# Patient Record
Sex: Female | Born: 1960 | Race: Black or African American | Hispanic: Refuse to answer | Smoking: Never smoker
Health system: Southern US, Community
[De-identification: ages and names within clinical notes are randomized; demographics above are authoritative.]

## PROBLEM LIST (undated history)

## (undated) DIAGNOSIS — I10 Essential (primary) hypertension: Secondary | ICD-10-CM

## (undated) HISTORY — PX: TUBAL LIGATION: SHX77

## (undated) HISTORY — PX: OTHER SURGICAL HISTORY: SHX169

---

## 2002-08-22 LAB — HM PAP SMEAR: HM PAP: NORMAL

## 2009-10-19 ENCOUNTER — Ambulatory Visit: Payer: Self-pay | Admitting: Family Medicine

## 2010-11-03 ENCOUNTER — Emergency Department: Payer: Self-pay | Admitting: Emergency Medicine

## 2013-04-06 ENCOUNTER — Emergency Department (HOSPITAL_COMMUNITY): Admitting: Anesthesiology

## 2013-04-06 ENCOUNTER — Encounter (HOSPITAL_COMMUNITY): Payer: Self-pay | Admitting: Emergency Medicine

## 2013-04-06 ENCOUNTER — Encounter (HOSPITAL_COMMUNITY): Admitting: Anesthesiology

## 2013-04-06 ENCOUNTER — Ambulatory Visit (HOSPITAL_COMMUNITY)
Admission: EM | Admit: 2013-04-06 | Discharge: 2013-04-08 | Disposition: A | Discharge status: Home / Self Care | Attending: Emergency Medicine | Admitting: Emergency Medicine

## 2013-04-06 ENCOUNTER — Emergency Department (HOSPITAL_COMMUNITY)

## 2013-04-06 ENCOUNTER — Encounter (HOSPITAL_COMMUNITY)
Admission: EM | Disposition: A | Discharge status: Home / Self Care | Payer: Self-pay | Source: Home / Self Care | Attending: Emergency Medicine

## 2013-04-06 DIAGNOSIS — S5400XA Injury of ulnar nerve at forearm level, unspecified arm, initial encounter: Secondary | ICD-10-CM | POA: Diagnosis not present

## 2013-04-06 DIAGNOSIS — S56909A Unspecified injury of unspecified muscles, fascia and tendons at forearm level, unspecified arm, initial encounter: Secondary | ICD-10-CM | POA: Diagnosis not present

## 2013-04-06 DIAGNOSIS — S51809A Unspecified open wound of unspecified forearm, initial encounter: Secondary | ICD-10-CM | POA: Diagnosis not present

## 2013-04-06 DIAGNOSIS — S41109A Unspecified open wound of unspecified upper arm, initial encounter: Secondary | ICD-10-CM | POA: Insufficient documentation

## 2013-04-06 DIAGNOSIS — S53499A Other sprain of unspecified elbow, initial encounter: Secondary | ICD-10-CM | POA: Diagnosis not present

## 2013-04-06 DIAGNOSIS — S56819A Strain of other muscles, fascia and tendons at forearm level, unspecified arm, initial encounter: Secondary | ICD-10-CM

## 2013-04-06 DIAGNOSIS — S41111A Laceration without foreign body of right upper arm, initial encounter: Secondary | ICD-10-CM

## 2013-04-06 DIAGNOSIS — S46909A Unspecified injury of unspecified muscle, fascia and tendon at shoulder and upper arm level, unspecified arm, initial encounter: Secondary | ICD-10-CM | POA: Diagnosis not present

## 2013-04-06 DIAGNOSIS — S51009A Unspecified open wound of unspecified elbow, initial encounter: Secondary | ICD-10-CM | POA: Diagnosis not present

## 2013-04-06 DIAGNOSIS — S5410XA Injury of median nerve at forearm level, unspecified arm, initial encounter: Secondary | ICD-10-CM | POA: Insufficient documentation

## 2013-04-06 DIAGNOSIS — I10 Essential (primary) hypertension: Secondary | ICD-10-CM | POA: Insufficient documentation

## 2013-04-06 DIAGNOSIS — S471XXA Crushing injury of right shoulder and upper arm, initial encounter: Secondary | ICD-10-CM | POA: Diagnosis present

## 2013-04-06 DIAGNOSIS — S5700XA Crushing injury of unspecified elbow, initial encounter: Secondary | ICD-10-CM | POA: Insufficient documentation

## 2013-04-06 HISTORY — PX: NERVE EXPLORATION: SHX2082

## 2013-04-06 HISTORY — DX: Essential (primary) hypertension: I10

## 2013-04-06 LAB — CBC
HCT: 36.9 % (ref 36.0–46.0)
Hemoglobin: 12.6 g/dL (ref 12.0–15.0)
MCH: 29.9 pg (ref 26.0–34.0)
MCHC: 34.1 g/dL (ref 30.0–36.0)
MCV: 87.4 fL (ref 78.0–100.0)
PLATELETS: 196 10*3/uL (ref 150–400)
RBC: 4.22 MIL/uL (ref 3.87–5.11)
RDW: 12.6 % (ref 11.5–15.5)
WBC: 11.3 10*3/uL — ABNORMAL HIGH (ref 4.0–10.5)

## 2013-04-06 LAB — COMPREHENSIVE METABOLIC PANEL
ALK PHOS: 51 U/L (ref 39–117)
ALT: 14 U/L (ref 0–35)
AST: 21 U/L (ref 0–37)
Albumin: 3.2 g/dL — ABNORMAL LOW (ref 3.5–5.2)
BILIRUBIN TOTAL: 0.2 mg/dL — AB (ref 0.3–1.2)
BUN: 11 mg/dL (ref 6–23)
CHLORIDE: 101 meq/L (ref 96–112)
CO2: 27 mEq/L (ref 19–32)
Calcium: 9.1 mg/dL (ref 8.4–10.5)
Creatinine, Ser: 0.85 mg/dL (ref 0.50–1.10)
GFR calc non Af Amer: 76 mL/min — ABNORMAL LOW (ref >90)
GFR, EST AFRICAN AMERICAN: 88 mL/min — AB (ref >90)
GLUCOSE: 175 mg/dL — AB (ref 70–99)
POTASSIUM: 2.9 meq/L — AB (ref 3.7–5.3)
Sodium: 141 mEq/L (ref 137–147)
Total Protein: 6.6 g/dL (ref 6.0–8.3)

## 2013-04-06 LAB — SAMPLE TO BLOOD BANK

## 2013-04-06 LAB — CDS SEROLOGY

## 2013-04-06 LAB — PROTIME-INR
INR: 0.97 (ref 0.00–1.49)
Prothrombin Time: 12.7 seconds (ref 11.6–15.2)

## 2013-04-06 LAB — HIV RAPID SCREEN (BLD OR BODY FLD EXPOSURE): Rapid HIV Screen: NONREACTIVE

## 2013-04-06 LAB — HEPATITIS B SURFACE ANTIGEN: HEP B S AG: NEGATIVE

## 2013-04-06 LAB — CK: Total CK: 436 U/L — ABNORMAL HIGH (ref 7–177)

## 2013-04-06 LAB — HEPATITIS C ANTIBODY (REFLEX): HCV Ab: NEGATIVE

## 2013-04-06 SURGERY — EXPLORATION, NERVE
Anesthesia: General | Laterality: Right

## 2013-04-06 MED ORDER — ROCURONIUM BROMIDE 100 MG/10ML IV SOLN
INTRAVENOUS | Status: DC | PRN
  Administered 2013-04-06: 5 mg via INTRAVENOUS

## 2013-04-06 MED ORDER — METHOCARBAMOL 100 MG/ML IJ SOLN
500.0000 mg | Freq: Four times a day (QID) | INTRAVENOUS | Status: DC | PRN
  Filled 2013-04-06: qty 5

## 2013-04-06 MED ORDER — CEFAZOLIN SODIUM 1-5 GM-% IV SOLN
1.0000 g | Freq: Once | INTRAVENOUS | Status: AC
  Administered 2013-04-06: 1 g via INTRAVENOUS
  Filled 2013-04-06: qty 50

## 2013-04-06 MED ORDER — FENTANYL CITRATE 0.05 MG/ML IJ SOLN
INTRAMUSCULAR | Status: DC | PRN
  Administered 2013-04-06 (×2): 50 ug via INTRAVENOUS

## 2013-04-06 MED ORDER — OXYCODONE-ACETAMINOPHEN 5-325 MG PO TABS
1.0000 | ORAL_TABLET | ORAL | Status: DC | PRN
  Administered 2013-04-07 – 2013-04-08 (×4): 2 via ORAL
  Filled 2013-04-06 (×4): qty 2

## 2013-04-06 MED ORDER — MORPHINE SULFATE 4 MG/ML IJ SOLN
4.0000 mg | Freq: Once | INTRAMUSCULAR | Status: AC
  Administered 2013-04-06: 4 mg via INTRAVENOUS
  Filled 2013-04-06: qty 1

## 2013-04-06 MED ORDER — TRIAMTERENE-HCTZ 37.5-25 MG PO TABS
1.0000 | ORAL_TABLET | Freq: Every day | ORAL | Status: DC
  Administered 2013-04-07 – 2013-04-08 (×2): 1 via ORAL
  Filled 2013-04-06 (×2): qty 1

## 2013-04-06 MED ORDER — OXYCODONE HCL 5 MG/5ML PO SOLN: 5.0000 mg | Freq: Once | ORAL | Status: DC | PRN

## 2013-04-06 MED ORDER — KCL IN DEXTROSE-NACL 20-5-0.45 MEQ/L-%-% IV SOLN
INTRAVENOUS | Status: DC
  Administered 2013-04-06: 22:00:00 via INTRAVENOUS
  Filled 2013-04-06 (×4): qty 1000

## 2013-04-06 MED ORDER — SUCCINYLCHOLINE CHLORIDE 20 MG/ML IJ SOLN
INTRAMUSCULAR | Status: AC
  Filled 2013-04-06: qty 1

## 2013-04-06 MED ORDER — SUCCINYLCHOLINE CHLORIDE 20 MG/ML IJ SOLN
INTRAMUSCULAR | Status: DC | PRN
  Administered 2013-04-06: 120 mg via INTRAVENOUS

## 2013-04-06 MED ORDER — PHENYLEPHRINE HCL 10 MG/ML IJ SOLN
INTRAMUSCULAR | Status: DC | PRN
  Administered 2013-04-06 (×3): 80 ug via INTRAVENOUS

## 2013-04-06 MED ORDER — ALPRAZOLAM 0.25 MG PO TABS: 0.2500 mg | ORAL_TABLET | Freq: Every evening | ORAL | Status: DC | PRN

## 2013-04-06 MED ORDER — ONDANSETRON HCL 4 MG/2ML IJ SOLN
4.0000 mg | Freq: Four times a day (QID) | INTRAMUSCULAR | Status: DC | PRN
  Administered 2013-04-07: 4 mg via INTRAVENOUS
  Filled 2013-04-06: qty 2

## 2013-04-06 MED ORDER — OXYCODONE HCL 5 MG PO TABS: 5.0000 mg | ORAL_TABLET | Freq: Once | ORAL | Status: DC | PRN

## 2013-04-06 MED ORDER — LACTATED RINGERS IV SOLN
INTRAVENOUS | Status: DC | PRN
  Administered 2013-04-06: 19:00:00 via INTRAVENOUS

## 2013-04-06 MED ORDER — LIDOCAINE HCL (CARDIAC) 20 MG/ML IV SOLN
INTRAVENOUS | Status: AC
  Filled 2013-04-06: qty 5

## 2013-04-06 MED ORDER — OXYCODONE-ACETAMINOPHEN 10-325 MG PO TABS: 1.0000 | ORAL_TABLET | ORAL | Status: DC | PRN

## 2013-04-06 MED ORDER — KCL IN DEXTROSE-NACL 20-5-0.45 MEQ/L-%-% IV SOLN
INTRAVENOUS | Status: AC
Start: 2013-04-06 — End: 2013-04-07
  Filled 2013-04-06: qty 1000

## 2013-04-06 MED ORDER — ONDANSETRON HCL 4 MG/2ML IJ SOLN
INTRAMUSCULAR | Status: AC
  Filled 2013-04-06: qty 2

## 2013-04-06 MED ORDER — HYDROMORPHONE HCL PF 1 MG/ML IJ SOLN: 0.2500 mg | INTRAMUSCULAR | Status: DC | PRN

## 2013-04-06 MED ORDER — PROPOFOL 10 MG/ML IV BOLUS
INTRAVENOUS | Status: DC | PRN
  Administered 2013-04-06: 180 mg via INTRAVENOUS

## 2013-04-06 MED ORDER — ALBUMIN HUMAN 5 % IV SOLN
INTRAVENOUS | Status: DC | PRN
  Administered 2013-04-06: 20:00:00 via INTRAVENOUS

## 2013-04-06 MED ORDER — MIDAZOLAM HCL 5 MG/5ML IJ SOLN
INTRAMUSCULAR | Status: DC | PRN
  Administered 2013-04-06: 2 mg via INTRAVENOUS

## 2013-04-06 MED ORDER — CEPHALEXIN 500 MG PO CAPS: 500.0000 mg | ORAL_CAPSULE | Freq: Four times a day (QID) | ORAL | Status: DC

## 2013-04-06 MED ORDER — FENTANYL CITRATE 0.05 MG/ML IJ SOLN
INTRAMUSCULAR | Status: AC
  Filled 2013-04-06: qty 5

## 2013-04-06 MED ORDER — MIDAZOLAM HCL 2 MG/2ML IJ SOLN
INTRAMUSCULAR | Status: AC
  Filled 2013-04-06: qty 2

## 2013-04-06 MED ORDER — LIDOCAINE HCL (CARDIAC) 20 MG/ML IV SOLN
INTRAVENOUS | Status: DC | PRN
  Administered 2013-04-06: 40 mg via INTRAVENOUS

## 2013-04-06 MED ORDER — DOCUSATE SODIUM 100 MG PO CAPS: 100.0000 mg | ORAL_CAPSULE | Freq: Two times a day (BID) | ORAL | Status: DC

## 2013-04-06 MED ORDER — SODIUM CHLORIDE 0.9 % IR SOLN
Status: DC | PRN
  Administered 2013-04-06: 4000 mL

## 2013-04-06 MED ORDER — ESCITALOPRAM OXALATE 10 MG PO TABS
10.0000 mg | ORAL_TABLET | Freq: Every day | ORAL | Status: DC
  Filled 2013-04-06 (×3): qty 1

## 2013-04-06 MED ORDER — ONDANSETRON HCL 4 MG PO TABS: 4.0000 mg | ORAL_TABLET | Freq: Four times a day (QID) | ORAL | Status: DC | PRN

## 2013-04-06 MED ORDER — ROCURONIUM BROMIDE 50 MG/5ML IV SOLN
INTRAVENOUS | Status: AC
  Filled 2013-04-06: qty 1

## 2013-04-06 MED ORDER — METHOCARBAMOL 500 MG PO TABS: 500.0000 mg | ORAL_TABLET | Freq: Four times a day (QID) | ORAL | Status: DC

## 2013-04-06 MED ORDER — CEFAZOLIN SODIUM 1-5 GM-% IV SOLN
INTRAVENOUS | Status: DC | PRN
  Administered 2013-04-06: 1 g via INTRAVENOUS

## 2013-04-06 MED ORDER — METHOCARBAMOL 500 MG PO TABS: 500.0000 mg | ORAL_TABLET | Freq: Four times a day (QID) | ORAL | Status: DC | PRN

## 2013-04-06 MED ORDER — ARTIFICIAL TEARS OP OINT
TOPICAL_OINTMENT | OPHTHALMIC | Status: AC
  Filled 2013-04-06: qty 3.5

## 2013-04-06 MED ORDER — TETANUS-DIPHTH-ACELL PERTUSSIS 5-2.5-18.5 LF-MCG/0.5 IM SUSP
0.5000 mL | Freq: Once | INTRAMUSCULAR | Status: AC
  Administered 2013-04-06: 0.5 mL via INTRAMUSCULAR
  Filled 2013-04-06: qty 0.5

## 2013-04-06 MED ORDER — PROPOFOL 10 MG/ML IV BOLUS
INTRAVENOUS | Status: AC
  Filled 2013-04-06: qty 20

## 2013-04-06 SURGICAL SUPPLY — 51 items
BANDAGE ELASTIC 3 VELCRO ST LF (GAUZE/BANDAGES/DRESSINGS) IMPLANT
BANDAGE ELASTIC 4 VELCRO ST LF (GAUZE/BANDAGES/DRESSINGS) ×3 IMPLANT
BANDAGE GAUZE ELAST BULKY 4 IN (GAUZE/BANDAGES/DRESSINGS) IMPLANT
BNDG COHESIVE 1X5 TAN STRL LF (GAUZE/BANDAGES/DRESSINGS) IMPLANT
BNDG GAUZE ELAST 4 BULKY (GAUZE/BANDAGES/DRESSINGS) ×3 IMPLANT
CORDS BIPOLAR (ELECTRODE) ×3 IMPLANT
COVER SURGICAL LIGHT HANDLE (MISCELLANEOUS) ×3 IMPLANT
CUFF TOURNIQUET SINGLE 18IN (TOURNIQUET CUFF) ×3 IMPLANT
CUFF TOURNIQUET SINGLE 24IN (TOURNIQUET CUFF) IMPLANT
DRAPE OEC MINIVIEW 54X84 (DRAPES) ×3 IMPLANT
DRAPE SURG 17X23 STRL (DRAPES) ×3 IMPLANT
DRSG ADAPTIC 3X8 NADH LF (GAUZE/BANDAGES/DRESSINGS) IMPLANT
GAUZE SPONGE 2X2 8PLY STRL LF (GAUZE/BANDAGES/DRESSINGS) IMPLANT
GAUZE XEROFORM 5X9 LF (GAUZE/BANDAGES/DRESSINGS) ×3 IMPLANT
GLOVE BIOGEL PI IND STRL 8.5 (GLOVE) ×1 IMPLANT
GLOVE BIOGEL PI INDICATOR 8.5 (GLOVE) ×2
GLOVE SURG ORTHO 8.0 STRL STRW (GLOVE) ×3 IMPLANT
GOWN STRL REUS W/ TWL LRG LVL3 (GOWN DISPOSABLE) ×2 IMPLANT
GOWN STRL REUS W/ TWL XL LVL3 (GOWN DISPOSABLE) ×1 IMPLANT
GOWN STRL REUS W/TWL LRG LVL3 (GOWN DISPOSABLE) ×4
GOWN STRL REUS W/TWL XL LVL3 (GOWN DISPOSABLE) ×2
KIT BASIN OR (CUSTOM PROCEDURE TRAY) ×3 IMPLANT
KIT ROOM TURNOVER OR (KITS) ×3 IMPLANT
MANIFOLD NEPTUNE II (INSTRUMENTS) ×3 IMPLANT
NEEDLE HYPO 25GX1X1/2 BEV (NEEDLE) IMPLANT
NS IRRIG 1000ML POUR BTL (IV SOLUTION) ×3 IMPLANT
PACK ORTHO EXTREMITY (CUSTOM PROCEDURE TRAY) ×3 IMPLANT
PAD ARMBOARD 7.5X6 YLW CONV (MISCELLANEOUS) ×6 IMPLANT
PAD CAST 4YDX4 CTTN HI CHSV (CAST SUPPLIES) IMPLANT
PADDING CAST COTTON 4X4 STRL (CAST SUPPLIES)
SOAP 2 % CHG 4 OZ (WOUND CARE) ×3 IMPLANT
SPECIMEN JAR SMALL (MISCELLANEOUS) ×3 IMPLANT
SPONGE GAUZE 2X2 STER 10/PKG (GAUZE/BANDAGES/DRESSINGS)
SPONGE GAUZE 4X4 12PLY (GAUZE/BANDAGES/DRESSINGS) ×3 IMPLANT
STAPLER VISISTAT 35W (STAPLE) ×3 IMPLANT
SUCTION FRAZIER TIP 10 FR DISP (SUCTIONS) IMPLANT
SUT MERSILENE 4 0 P 3 (SUTURE) IMPLANT
SUT MON AB 2-0 CT1 36 (SUTURE) ×6 IMPLANT
SUT MON AB 3-0 SH 27 (SUTURE) ×4
SUT MON AB 3-0 SH27 (SUTURE) ×2 IMPLANT
SUT PROLENE 2 0 SH 30 (SUTURE) ×6 IMPLANT
SUT PROLENE 4 0 PS 2 18 (SUTURE) IMPLANT
SUT VIC AB 2-0 CT1 27 (SUTURE)
SUT VIC AB 2-0 CT1 TAPERPNT 27 (SUTURE) IMPLANT
SYR CONTROL 10ML LL (SYRINGE) IMPLANT
TOWEL OR 17X24 6PK STRL BLUE (TOWEL DISPOSABLE) ×3 IMPLANT
TOWEL OR 17X26 10 PK STRL BLUE (TOWEL DISPOSABLE) ×3 IMPLANT
TUBE CONNECTING 12'X1/4 (SUCTIONS)
TUBE CONNECTING 12X1/4 (SUCTIONS) IMPLANT
UNDERPAD 30X30 INCONTINENT (UNDERPADS AND DIAPERS) ×3 IMPLANT
WATER STERILE IRR 1000ML POUR (IV SOLUTION) ×3 IMPLANT

## 2013-04-06 NOTE — Discharge Instructions (Signed)
KEEP BANDAGE CLEAN AND DRY CALL OFFICE FOR F/U APPT [phone] IN ONE WEEK ALSO CALL FOR THERAPY APPOINTMENT IN ONE WEEK [phone] [phone]  KEEP HAND ELEVATED ABOVE HEART OK TO APPLY ICE TO OPERATIVE AREA CONTACT OFFICE IF ANY WORSENING PAIN OR CONCERNS.
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
## 2013-04-06 NOTE — Transfer of Care (Signed)
Immediate Anesthesia Transfer of Care Note  Patient: [name]  Procedure(s) Performed: Procedure(s) with comments: NERVE EXPLORATION and Repair (Right) - right arm wound exploration and repair as indicated  Patient Location: PACU  Anesthesia Type:General  Level of Consciousness: sedated  Airway & Oxygen Therapy: Patient Spontanous Breathing and Patient connected to face mask oxygen  Post-op Assessment: Report given to PACU RN and Post -op Vital signs reviewed and stable  Post vital signs: Reviewed and stable  Complications: No apparent anesthesia complications
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
## 2013-04-06 NOTE — Anesthesia Procedure Notes (Signed)
Procedure Name: Intubation Date/Time: 04/06/2013 7:50 PM Performed by: Brien MatesMAHONY, Marquite Attwood D Pre-anesthesia Checklist: Patient identified, Emergency Drugs available, Suction available, Patient being monitored and Timeout performed Patient Re-evaluated:Patient Re-evaluated prior to inductionOxygen Delivery Method: Circle system utilized Preoxygenation: Pre-oxygenation with 100% oxygen Intubation Type: IV induction, Cricoid Pressure applied and Rapid sequence Laryngoscope Size: Miller and 2 Grade View: Grade I Tube type: Oral Tube size: 7.5 mm Number of attempts: 1 Airway Equipment and Method: Stylet Placement Confirmation: ETT inserted through vocal cords under direct vision,  positive ETCO2 and breath sounds checked- equal and bilateral Secured at: 21 cm Tube secured with: Tape Dental Injury: Teeth and Oropharynx as per pre-operative assessment
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
## 2013-04-06 NOTE — Brief Op Note (Signed)
04/06/2013  6:38 PM  PATIENT:  [name]  53 y.o. female  PRE-OPERATIVE DIAGNOSIS:  right arm wound   POST-OPERATIVE DIAGNOSIS:  * No post-op diagnosis entered *  PROCEDURE:  Procedure(s) with comments: NERVE EXPLORATION and Repair (Right) - right arm wound exploration and repair as indicated Ulnar nerve release at elbow [id number] Median nerve neuroplasty at elbow [phone] Right elbow brachial artery exploration Right elbow medial epicondylar [phone] Right elbow MCL [phone] Right elbow joint arthrotomy and debridement-24000 Right elbow ecrb repair, common extensor-24341 Open debridement right arm, elbow, forearm-11044,11045,11044 Repair wound arm 13121,13122,13122,  Repair wound arm [phone]  SURGEON:  Surgeon(s) and Role:    * Sharma CovertFred W Louden Houseworth, MD - Primary  PHYSICIAN ASSISTANT:   ASSISTANTS: none   ANESTHESIA:   general  EBL:     BLOOD ADMINISTERED:none  DRAINS: none   LOCAL MEDICATIONS USED:  none  SPECIMEN:  No Specimen  DISPOSITION OF SPECIMEN:  N/A  COUNTS:  YES  TOURNIQUET:    DICTATION: .[id number]   PLAN OF CARE: Admit to inpatient  PATIENT DISPOSITION:  PACU - hemodynamically stable.   Delay start of Pharmacological VTE agent (>24hrs) due to surgical blood loss or risk of bleeding: not applicable

## 2013-04-06 NOTE — ED Notes (Signed)
Pt returned from xray and CT VSS

## 2013-04-06 NOTE — Anesthesia Postprocedure Evaluation (Signed)
  Anesthesia Post-op Note  Patient: [name]  Procedure(s) Performed: Procedure(s) with comments: NERVE EXPLORATION and Repair (Right) - right arm wound exploration and repair as indicated  Patient Location: PACU  Anesthesia Type:General  Level of Consciousness: awake and alert   Airway and Oxygen Therapy: Patient Spontanous Breathing  Post-op Pain: none  Post-op Assessment: Post-op Vital signs reviewed, Patient's Cardiovascular Status Stable and Respiratory Function Stable  Post-op Vital Signs: Reviewed  Filed Vitals:   04/06/13 2145  BP: 133/64  Pulse: 66  Temp:   Resp: 13    Complications: No apparent anesthesia complications
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
## 2013-04-06 NOTE — ED Notes (Signed)
[employer] worker at bedside with patient.

## 2013-04-06 NOTE — ED Notes (Signed)
EDP [facility] notified of pt Critical Potassium per Shanda BumpsJessica, Charity fundraiserN.

## 2013-04-06 NOTE — ED Notes (Signed)
Pt to OR.  Husband with pt.  Report given to Shireen Quanobert Todd, RN

## 2013-04-06 NOTE — Progress Notes (Signed)
[facility] to [facility], CRNA

## 2013-04-07 ENCOUNTER — Encounter (HOSPITAL_COMMUNITY): Payer: Self-pay | Admitting: Surgery

## 2013-04-07 LAB — POTASSIUM: Potassium: 3.6 mEq/L — ABNORMAL LOW (ref 3.7–5.3)

## 2013-04-07 MED ORDER — DIPHENHYDRAMINE HCL 25 MG PO CAPS: 25.0000 mg | ORAL_CAPSULE | Freq: Four times a day (QID) | ORAL | Status: DC | PRN

## 2013-04-07 MED ORDER — CEFAZOLIN SODIUM 1-5 GM-% IV SOLN: 1.0000 g | INTRAVENOUS | Status: DC

## 2013-04-07 MED ORDER — DOCUSATE SODIUM 100 MG PO CAPS
100.0000 mg | ORAL_CAPSULE | Freq: Two times a day (BID) | ORAL | Status: DC
  Administered 2013-04-07 – 2013-04-08 (×2): 100 mg via ORAL
  Filled 2013-04-07 (×2): qty 1

## 2013-04-07 MED ORDER — CEFAZOLIN SODIUM-DEXTROSE 2-3 GM-% IV SOLR: 2.0000 g | INTRAVENOUS | Status: DC

## 2013-04-07 MED ORDER — ASPIRIN 325 MG PO TABS
325.0000 mg | ORAL_TABLET | Freq: Two times a day (BID) | ORAL | Status: DC
  Administered 2013-04-07: 325 mg via ORAL
  Filled 2013-04-07 (×3): qty 1

## 2013-04-07 MED ORDER — CHLORHEXIDINE GLUCONATE 4 % EX LIQD: 60.0000 mL | Freq: Once | CUTANEOUS | Status: DC

## 2013-04-07 MED ORDER — CEPHALEXIN 500 MG PO CAPS: 500.0000 mg | ORAL_CAPSULE | Freq: Four times a day (QID) | ORAL | Status: DC

## 2013-04-07 MED ORDER — ALPRAZOLAM 0.5 MG PO TABS: 0.5000 mg | ORAL_TABLET | Freq: Four times a day (QID) | ORAL | Status: DC | PRN

## 2013-04-07 MED ORDER — ONDANSETRON HCL 4 MG PO TABS: 4.0000 mg | ORAL_TABLET | Freq: Three times a day (TID) | ORAL | Status: DC | PRN

## 2013-04-07 MED ORDER — VITAMIN C 500 MG PO TABS
1000.0000 mg | ORAL_TABLET | Freq: Every day | ORAL | Status: DC
  Filled 2013-04-07 (×2): qty 2

## 2013-04-07 MED ORDER — METHOCARBAMOL 500 MG PO TABS: 500.0000 mg | ORAL_TABLET | Freq: Four times a day (QID) | ORAL | Status: DC

## 2013-04-07 MED ORDER — HYDROMORPHONE HCL PF 1 MG/ML IJ SOLN: 0.5000 mg | INTRAMUSCULAR | Status: DC | PRN

## 2013-04-07 MED ORDER — HYDROCODONE-ACETAMINOPHEN 5-325 MG PO TABS: 1.0000 | ORAL_TABLET | ORAL | Status: DC | PRN

## 2013-04-07 MED ORDER — CEFAZOLIN SODIUM 1-5 GM-% IV SOLN
1.0000 g | Freq: Three times a day (TID) | INTRAVENOUS | Status: DC
  Administered 2013-04-07 – 2013-04-08 (×3): 1 g via INTRAVENOUS
  Filled 2013-04-07 (×5): qty 50

## 2013-04-07 MED ORDER — ADULT MULTIVITAMIN W/MINERALS CH
1.0000 | ORAL_TABLET | Freq: Every day | ORAL | Status: DC
  Filled 2013-04-07 (×2): qty 1

## 2013-04-07 NOTE — Progress Notes (Signed)
Subjective: 1 Day Post-Op Procedure(s) (LRB): NERVE EXPLORATION and Repair (Right) Patient reports pain as mild Pt with nausea/vomiting, not tolerating good po  Objective: Vital signs in last 24 hours: Temp:  [98.1 F (36.7 C)-99.2 F (37.3 C)] 98.1 F (36.7 C) (03/12 1020) Pulse Rate:  [52-78] 74 (03/12 1020) Resp:  [12-18] 18 (03/12 1020) BP: (90-150)/(45-95) 139/60 mmHg (03/12 1020) SpO2:  [91 %-100 %] 97 % (03/12 1020) Weight:  [107 kg (235 lb 14.3 oz)] 107 kg (235 lb 14.3 oz) (03/11 2230)  Intake/Output from previous day: 03/11 0701 - 03/12 0700 In: 1050 [I.V.:800; IV Piggyback:250] Out: -  Intake/Output this shift: Total I/O In: 1368.8 [P.O.:120; I.V.:1198.8; IV Piggyback:50] Out: -    Recent Labs  04/06/13 1333  HGB 12.6    Recent Labs  04/06/13 1333  WBC 11.3*  RBC 4.22  HCT 36.9  PLT 196    Recent Labs  04/06/13 1333 04/07/13 1225  NA 141  --   K 2.9* 3.6*  CL 101  --   CO2 27  --   BUN 11  --   CREATININE 0.85  --   GLUCOSE 175*  --   CALCIUM 9.1  --     Recent Labs  04/06/13 1333  INR 0.97    RUE: SPLINT IN PLACE FINGERS WARM WELL PERFUSED ABLE TO EXTEND DIGITS POOR FLEXION AS SAME AS PREOP  Assessment/Plan: 1 Day Post-Op Procedure(s) (LRB): NERVE EXPLORATION and Repair (Right) CONTINUE INPATIENT CARE POOR PO INTAKE AND VOMITING LIKELY HOME IN AM PT VOICED UNDERSTANDING OF PLAN  Sharma CovertORTMANN,Kilee Hedding W 04/07/2013, 2:44 PM
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
## 2013-04-08 NOTE — Progress Notes (Signed)
Orthopedic Tech Progress Note Patient Details:  [name] 03-Jul-1960 [id number]  Ortho Devices Type of Ortho Device: Shoulder immobilizer Ortho Device/Splint Interventions: Application   Cammer, Mickie BailJennifer Carol 04/08/2013, 1:12 PM

## 2013-04-08 NOTE — Progress Notes (Signed)
Discharge home. Home discharge instruction given, no questions verbalized. 
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
## 2014-10-09 ENCOUNTER — Other Ambulatory Visit: Payer: Self-pay | Admitting: Family Medicine

## 2014-10-09 DIAGNOSIS — M5430 Sciatica, unspecified side: Secondary | ICD-10-CM | POA: Insufficient documentation

## 2014-10-09 DIAGNOSIS — I10 Essential (primary) hypertension: Secondary | ICD-10-CM | POA: Insufficient documentation

## 2014-10-09 DIAGNOSIS — G4700 Insomnia, unspecified: Secondary | ICD-10-CM | POA: Insufficient documentation

## 2014-10-09 NOTE — Telephone Encounter (Signed)
Last ov 03/01/2014 (Saw Nadine Counts)  Thanks,   -Vernona Rieger

## 2015-05-18 ENCOUNTER — Telehealth: Payer: Self-pay

## 2015-05-18 NOTE — Telephone Encounter (Signed)
Ok to put in a same day? Please advise. Thanks!

## 2015-05-18 NOTE — Telephone Encounter (Signed)
Patient called and states that she is off Monday and then also Tuesday and she wanted to come in and see you to get FMLA papers filled out. There is nothing open only same day. Please review and let patient know thank you-aa

## 2015-05-18 NOTE — Telephone Encounter (Signed)
Ok to put in same day. Thanks.   

## 2015-05-21 NOTE — Telephone Encounter (Signed)
Tried calling; both contact numbers are ringing busy.  Thanks,   -Vernona RiegerLaura

## 2015-05-28 ENCOUNTER — Encounter: Payer: Self-pay | Admitting: Family Medicine

## 2015-05-28 ENCOUNTER — Ambulatory Visit (INDEPENDENT_AMBULATORY_CARE_PROVIDER_SITE_OTHER): Payer: PRIVATE HEALTH INSURANCE | Admitting: Family Medicine

## 2015-05-28 VITALS — BP 116/74 | HR 64 | Temp 98.3°F | Resp 20 | Ht 60.0 in | Wt 246.0 lb

## 2015-05-28 DIAGNOSIS — M545 Low back pain, unspecified: Secondary | ICD-10-CM | POA: Insufficient documentation

## 2015-05-28 DIAGNOSIS — D649 Anemia, unspecified: Secondary | ICD-10-CM | POA: Insufficient documentation

## 2015-05-28 DIAGNOSIS — F329 Major depressive disorder, single episode, unspecified: Secondary | ICD-10-CM | POA: Insufficient documentation

## 2015-05-28 DIAGNOSIS — F419 Anxiety disorder, unspecified: Secondary | ICD-10-CM | POA: Insufficient documentation

## 2015-05-28 DIAGNOSIS — F32A Depression, unspecified: Secondary | ICD-10-CM | POA: Insufficient documentation

## 2015-05-28 DIAGNOSIS — IMO0002 Reserved for concepts with insufficient information to code with codable children: Secondary | ICD-10-CM | POA: Insufficient documentation

## 2015-05-28 DIAGNOSIS — R234 Changes in skin texture: Secondary | ICD-10-CM

## 2015-05-28 DIAGNOSIS — G8929 Other chronic pain: Secondary | ICD-10-CM

## 2015-05-28 DIAGNOSIS — R239 Unspecified skin changes: Secondary | ICD-10-CM

## 2015-05-28 MED ORDER — METHOCARBAMOL 500 MG PO TABS: 500.0000 mg | ORAL_TABLET | Freq: Every day | ORAL | Status: DC
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
## 2015-06-07 ENCOUNTER — Telehealth: Payer: Self-pay

## 2015-06-07 NOTE — Telephone Encounter (Signed)
Called pt to ask if she was seen by PT or another provider in 2016 for back pain (needs this information for FMLA paperwork). Pt reports she has not seen anybody in 2016 specifically for back pain. Allene DillonEmily Drozdowski, CMA

## 2015-06-14 ENCOUNTER — Ambulatory Visit (INDEPENDENT_AMBULATORY_CARE_PROVIDER_SITE_OTHER): Payer: PRIVATE HEALTH INSURANCE | Admitting: Family Medicine

## 2015-06-14 ENCOUNTER — Encounter: Payer: Self-pay | Admitting: Family Medicine

## 2015-06-14 VITALS — BP 106/68 | HR 68 | Temp 98.1°F | Resp 16 | Ht 61.75 in | Wt 242.0 lb

## 2015-06-14 DIAGNOSIS — Z0000 Encounter for general adult medical examination without abnormal findings: Secondary | ICD-10-CM | POA: Diagnosis not present

## 2015-06-14 DIAGNOSIS — D649 Anemia, unspecified: Secondary | ICD-10-CM

## 2015-06-14 DIAGNOSIS — I10 Essential (primary) hypertension: Secondary | ICD-10-CM

## 2015-06-14 DIAGNOSIS — H6090 Unspecified otitis externa, unspecified ear: Secondary | ICD-10-CM | POA: Insufficient documentation

## 2015-06-14 DIAGNOSIS — Z1211 Encounter for screening for malignant neoplasm of colon: Secondary | ICD-10-CM | POA: Diagnosis not present

## 2015-06-14 DIAGNOSIS — H6093 Unspecified otitis externa, bilateral: Secondary | ICD-10-CM | POA: Diagnosis not present

## 2015-06-14 DIAGNOSIS — F32A Depression, unspecified: Secondary | ICD-10-CM

## 2015-06-14 DIAGNOSIS — F329 Major depressive disorder, single episode, unspecified: Secondary | ICD-10-CM | POA: Diagnosis not present

## 2015-06-14 DIAGNOSIS — I890 Lymphedema, not elsewhere classified: Secondary | ICD-10-CM

## 2015-06-14 DIAGNOSIS — Z1239 Encounter for other screening for malignant neoplasm of breast: Secondary | ICD-10-CM

## 2015-06-14 MED ORDER — CIPROFLOXACIN-DEXAMETHASONE 0.3-0.1 % OT SUSP: 4.0000 [drp] | Freq: Two times a day (BID) | OTIC | Status: DC
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
## 2015-06-15 ENCOUNTER — Telehealth: Payer: Self-pay

## 2015-06-15 LAB — CBC WITH DIFFERENTIAL/PLATELET
Basophils Absolute: 0 10*3/uL (ref 0.0–0.2)
Basos: 1 %
EOS (ABSOLUTE): 0.1 10*3/uL (ref 0.0–0.4)
EOS: 3 %
HEMATOCRIT: 41.5 % (ref 34.0–46.6)
HEMOGLOBIN: 14.1 g/dL (ref 11.1–15.9)
IMMATURE GRANULOCYTES: 0 %
Immature Grans (Abs): 0 10*3/uL (ref 0.0–0.1)
LYMPHS: 52 %
Lymphocytes Absolute: 1.9 10*3/uL (ref 0.7–3.1)
MCH: 29.5 pg (ref 26.6–33.0)
MCHC: 34 g/dL (ref 31.5–35.7)
MCV: 87 fL (ref 79–97)
MONOS ABS: 0.2 10*3/uL (ref 0.1–0.9)
Monocytes: 6 %
NEUTROS PCT: 38 %
Neutrophils Absolute: 1.4 10*3/uL (ref 1.4–7.0)
Platelets: 248 10*3/uL (ref 150–379)
RBC: 4.78 x10E6/uL (ref 3.77–5.28)
RDW: 13.2 % (ref 12.3–15.4)
WBC: 3.6 10*3/uL (ref 3.4–10.8)

## 2015-06-15 LAB — LIPID PANEL
Chol/HDL Ratio: 3.5 ratio units (ref 0.0–4.4)
Cholesterol, Total: 202 mg/dL — ABNORMAL HIGH (ref 100–199)
HDL: 57 mg/dL (ref >39)
LDL CALC: 135 mg/dL — AB (ref 0–99)
TRIGLYCERIDES: 51 mg/dL (ref 0–149)
VLDL CHOLESTEROL CAL: 10 mg/dL (ref 5–40)

## 2015-06-15 LAB — COMPREHENSIVE METABOLIC PANEL
ALT: 12 IU/L (ref 0–32)
AST: 17 IU/L (ref 0–40)
Albumin/Globulin Ratio: 1.5 (ref 1.2–2.2)
Albumin: 4.1 g/dL (ref 3.5–5.5)
Alkaline Phosphatase: 65 IU/L (ref 39–117)
BUN/Creatinine Ratio: 14 (ref 9–23)
BUN: 9 mg/dL (ref 6–24)
Bilirubin Total: 0.4 mg/dL (ref 0.0–1.2)
CALCIUM: 9.6 mg/dL (ref 8.7–10.2)
CO2: 26 mmol/L (ref 18–29)
CREATININE: 0.64 mg/dL (ref 0.57–1.00)
Chloride: 99 mmol/L (ref 96–106)
GFR calc Af Amer: 115 mL/min/{1.73_m2} (ref >59)
GFR, EST NON AFRICAN AMERICAN: 100 mL/min/{1.73_m2} (ref >59)
GLOBULIN, TOTAL: 2.8 g/dL (ref 1.5–4.5)
Glucose: 99 mg/dL (ref 65–99)
Potassium: 3.8 mmol/L (ref 3.5–5.2)
Sodium: 141 mmol/L (ref 134–144)
Total Protein: 6.9 g/dL (ref 6.0–8.5)

## 2015-06-15 LAB — TSH: TSH: 0.773 u[IU]/mL (ref 0.450–4.500)

## 2015-06-15 NOTE — Telephone Encounter (Signed)
-----   Message from Lorie PhenixNancy Maloney, MD sent at 06/15/2015 10:51 AM EDT ----- Labs stable. Please notify patient. Thanks.

## 2015-06-15 NOTE — Telephone Encounter (Signed)
Advised pt of lab results. Pt verbally acknowledges understanding. Shatika Grinnell Drozdowski, CMA   

## 2015-06-27 ENCOUNTER — Telehealth: Payer: Self-pay | Admitting: Family Medicine

## 2015-06-27 DIAGNOSIS — F32A Depression, unspecified: Secondary | ICD-10-CM

## 2015-06-27 DIAGNOSIS — F329 Major depressive disorder, single episode, unspecified: Secondary | ICD-10-CM | POA: Insufficient documentation

## 2015-06-27 MED ORDER — BUPROPION HCL ER (SR) 150 MG PO TB12: 150.0000 mg | ORAL_TABLET | Freq: Two times a day (BID) | ORAL | Status: DC

## 2015-06-27 NOTE — Telephone Encounter (Signed)
Has seen Dr. Wallie CharPaula Pile.   Recommended starting Wellbutrin.  Will start medication and follow up with Dr. Damaris HippoPile.

## 2015-06-27 NOTE — Telephone Encounter (Signed)
Pt called saying her psychiatrist Dr. Wallie CharPaula Pile in [place] wants Dr. Elease HashimotoMaloney to call her regarding her medications.  Pt states her psy doctor wants to start her on Wellbutrin and wants to go over this with her..  Her doctors number is [phone]  The patients number is [phone]  [facility]

## 2015-06-27 NOTE — Telephone Encounter (Signed)
Please advise. Emily Drozdowski, CMA  

## 2015-07-05 ENCOUNTER — Ambulatory Visit: Payer: 59 | Attending: Family Medicine | Admitting: Occupational Therapy

## 2015-07-05 DIAGNOSIS — I890 Lymphedema, not elsewhere classified: Secondary | ICD-10-CM | POA: Diagnosis not present
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
## 2015-07-06 ENCOUNTER — Telehealth: Payer: Self-pay

## 2015-07-06 ENCOUNTER — Other Ambulatory Visit: Payer: Self-pay

## 2015-07-06 NOTE — Telephone Encounter (Signed)
Gastroenterology Pre-Procedure Review  Request Date: 08/28/15 Requesting Physician: Dr. Elease HashimotoMaloney  PATIENT REVIEW QUESTIONS: The patient responded to the following health history questions as indicated:    1. Are you having any GI issues? no 2. Do you have a personal history of Polyps? no 3. Do you have a family history of Colon Cancer or Polyps? no 4. Diabetes Mellitus? no 5. Joint replacements in the past 12 months?no 6. Major health problems in the past 3 months?no 7. Any artificial heart valves, MVP, or defibrillator?no    MEDICATIONS & ALLERGIES:    Patient reports the following regarding taking any anticoagulation/antiplatelet therapy:   Plavix, Coumadin, Eliquis, Xarelto, Lovenox, Pradaxa, Brilinta, or Effient? no Aspirin? no  Patient confirms/reports the following medications:  Current Outpatient Prescriptions  Medication Sig Dispense Refill  . buPROPion (WELLBUTRIN SR) 150 MG 12 hr tablet Take 1 tablet (150 mg total) by mouth 2 (two) times daily. 1 in a day for next 3 days and then 2 times daily 60 tablet 5  . ciprofloxacin-dexamethasone (CIPRODEX) otic suspension Place 4 drops into both ears 2 (two) times daily. 7.5 mL 0  . ibuprofen (ADVIL,MOTRIN) 200 MG tablet Take 200 mg by mouth every 6 (six) hours as needed.    . methocarbamol (ROBAXIN) 500 MG tablet Take 1 tablet (500 mg total) by mouth at bedtime. 30 tablet 5  . naproxen sodium (ANAPROX) 220 MG tablet Take 220 mg by mouth 2 (two) times daily as needed (for pain).    . triamterene-hydrochlorothiazide (MAXZIDE-25) 37.5-25 MG per tablet TAKE 1 TABLET BY MOUTH EVERYDAY 90 tablet 3   No current facility-administered medications for this visit.    Patient confirms/reports the following allergies:  No Known Allergies  No orders of the defined types were placed in this encounter.    AUTHORIZATION INFORMATION Primary Insurance: 1D#: Group #:  Secondary Insurance: 1D#: Group #:  SCHEDULE INFORMATION: Date:  08/28/15 Time: Location: [facility]

## 2015-07-25 ENCOUNTER — Encounter: Payer: Self-pay | Admitting: Family Medicine

## 2015-08-28 ENCOUNTER — Encounter: Admission: RE | Payer: Self-pay | Source: Ambulatory Visit

## 2015-08-28 ENCOUNTER — Ambulatory Visit
Admission: RE | Admit: 2015-08-28 | Payer: PRIVATE HEALTH INSURANCE | Source: Ambulatory Visit | Admitting: Gastroenterology

## 2015-08-28 ENCOUNTER — Other Ambulatory Visit: Payer: Self-pay

## 2015-08-28 SURGERY — COLONOSCOPY WITH PROPOFOL
Anesthesia: General

## 2015-08-29 ENCOUNTER — Telehealth: Payer: Self-pay

## 2015-08-29 NOTE — Telephone Encounter (Signed)
Screening Colonoscopy Z12.11 Rescheduled to [facility]17 Please pre-cert

## 2015-09-20 ENCOUNTER — Ambulatory Visit: Payer: 59 | Attending: Family Medicine | Admitting: Occupational Therapy

## 2015-09-20 DIAGNOSIS — I890 Lymphedema, not elsewhere classified: Secondary | ICD-10-CM | POA: Insufficient documentation
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
## 2015-09-20 NOTE — Patient Instructions (Signed)
Pt going to [facility] and pick up her Circ aids -  Pt to ask them to contact Rep about pump setup - if it is in the work  They need to show her donning and wearing   And after wearing 2 wks contact me for measurement - need to do pump am and pm for about 45 min  And can loosen circ aids in night time - tighten them during day  Contact me if any issues

## 2015-10-13 ENCOUNTER — Other Ambulatory Visit: Payer: Self-pay | Admitting: Family Medicine

## 2015-10-13 DIAGNOSIS — I10 Essential (primary) hypertension: Secondary | ICD-10-CM
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
## 2015-11-20 ENCOUNTER — Telehealth: Payer: Self-pay | Admitting: Gastroenterology

## 2015-11-20 NOTE — Telephone Encounter (Signed)
Patient needs to cancel her colonoscopy on 10/31 and will call back to reschedule in 2018

## 2015-11-21 NOTE — Telephone Encounter (Signed)
Patient called to cancel her colonoscopy

## 2015-11-23 NOTE — Telephone Encounter (Signed)
[facility] notified of cancellation.

## 2015-11-27 ENCOUNTER — Encounter: Admission: RE | Payer: Self-pay | Source: Ambulatory Visit

## 2015-11-27 ENCOUNTER — Ambulatory Visit
Admission: RE | Admit: 2015-11-27 | Payer: PRIVATE HEALTH INSURANCE | Source: Ambulatory Visit | Admitting: Gastroenterology

## 2015-11-27 SURGERY — COLONOSCOPY WITH PROPOFOL
Anesthesia: General

## 2016-01-23 ENCOUNTER — Ambulatory Visit (INDEPENDENT_AMBULATORY_CARE_PROVIDER_SITE_OTHER): Payer: PRIVATE HEALTH INSURANCE | Admitting: Family Medicine

## 2016-01-23 ENCOUNTER — Encounter: Payer: Self-pay | Admitting: Family Medicine

## 2016-01-23 VITALS — BP 130/88 | HR 57 | Temp 98.0°F | Resp 16 | Wt 248.0 lb

## 2016-01-23 DIAGNOSIS — M542 Cervicalgia: Secondary | ICD-10-CM | POA: Diagnosis not present

## 2016-01-23 DIAGNOSIS — H60501 Unspecified acute noninfective otitis externa, right ear: Secondary | ICD-10-CM | POA: Diagnosis not present

## 2016-01-23 MED ORDER — PREDNISONE 10 MG PO TABS: ORAL_TABLET | ORAL | 0 refills | Status: AC

## 2016-01-23 MED ORDER — NEOMYCIN-POLYMYXIN-HC 3.5-10000-1 OT SOLN: 3.0000 [drp] | Freq: Four times a day (QID) | OTIC | 0 refills | Status: AC

## 2016-01-23 NOTE — Progress Notes (Signed)
Patient: [name] Female    DOB: [birth year]   55 y.o.   MRN: [id number] Visit Date: 01/23/2016  Today's Provider: Mila Merryonald Fisher, MD   Chief Complaint  Patient presents with  . Neck Pain  . Ear Pain   Subjective:    Neck Pain   This is a new problem. Episode onset: 2 weeks ago. The problem occurs intermittently. The problem has been gradually worsening. The pain is present in the right side. Associated symptoms include headaches and tingling. Pertinent negatives include no chest pain, fever, leg pain, numbness, pain with swallowing, photophobia, syncope, trouble swallowing, visual change or weakness. She has tried NSAIDs for the symptoms. The treatment provided mild relief.       No Known Allergies   Current Outpatient Prescriptions:  .  buPROPion (WELLBUTRIN SR) 150 MG 12 hr tablet, Take 1 tablet (150 mg total) by mouth 2 (two) times daily. 1 in a day for next 3 days and then 2 times daily, Disp: 60 tablet, Rfl: 5 .  ibuprofen (ADVIL,MOTRIN) 200 MG tablet, Take 200 mg by mouth every 6 (six) hours as needed., Disp: , Rfl:  .  methocarbamol (ROBAXIN) 500 MG tablet, Take 1 tablet (500 mg total) by mouth at bedtime., Disp: 30 tablet, Rfl: 5 .  naproxen sodium (ANAPROX) 220 MG tablet, Take 220 mg by mouth 2 (two) times daily as needed (for pain)., Disp: , Rfl:  .  triamterene-hydrochlorothiazide (MAXZIDE-25) 37.5-25 MG tablet, TAKE 1 TABLET BY MOUTH EVERYDAY, Disp: 90 tablet, Rfl: 1  Review of Systems  Constitutional: Positive for diaphoresis. Negative for appetite change, chills, fatigue and fever.  HENT: Positive for ear pain (sharp pain behind right ear). Negative for trouble swallowing.   Eyes: Negative for photophobia.  Respiratory: Negative for chest tightness and shortness of breath.   Cardiovascular: Negative for chest pain, palpitations and syncope.  Gastrointestinal: Negative for abdominal pain, nausea and vomiting.  Musculoskeletal: Positive for arthralgias  (right shoulder), myalgias (right arm), neck pain and neck stiffness.  Neurological: Positive for tingling and headaches. Negative for dizziness, weakness and numbness.    Social History  Substance Use Topics  . Smoking status: Never Smoker  . Smokeless tobacco: Never Used  . Alcohol use Yes     Comment: social   Objective:   BP 130/88 (BP Location: Left Arm, Patient Position: Sitting, Cuff Size: Large)   Pulse (!) 57   Temp 98 F (36.7 C) (Oral)   Resp 16   Wt 248 lb (112.5 kg)   SpO2 98% Comment: room air  BMI 45.73 kg/m   Physical Exam  General appearance: alert, well developed, well nourished, cooperative and in no distress Head: Normocephalic, without obvious abnormality, atraumatic. Right ear canal mildly inflamed and tender.  Respiratory: Respirations even and unlabored, normal respiratory rate Extremities: Tender across mid and lower cspine. Tender over SCM and proximal trapezius. FROM of neck and shoulders, no sensory deficits.  Neurologic: Mental status: Alert, oriented to person, place, and time, thought content appropriate.     Assessment & Plan:     1. Neck pain  - predniSONE (DELTASONE) 10 MG tablet; 6 tablets for 1 day, then 5 for 1 day, then 4 for 1 day, then 3 for 1 day, then 2 for 1 day then 1 for 1 day.  Dispense: 21 tablet; Refill: 0  2. Acute otitis externa of right ear, unspecified type  - neomycin-polymyxin-hydrocortisone (CORTISPORIN) otic solution; Place 3 drops into the right  ear 4 (four) times daily.  Dispense: 10 mL; Refill: 0  .Call if symptoms change or if not rapidly improving.          Mila Merryonald Fisher, MD  [facility]

## 2016-01-23 NOTE — Patient Instructions (Signed)
   We will need to order Xray of your neck if it is not much better within 7 days. Please call for order for xray if you are not feeling much better by the end of next week.

## 2016-04-11 ENCOUNTER — Other Ambulatory Visit: Payer: Self-pay | Admitting: Physician Assistant

## 2016-04-11 DIAGNOSIS — I10 Essential (primary) hypertension: Secondary | ICD-10-CM

## 2016-05-16 ENCOUNTER — Ambulatory Visit (INDEPENDENT_AMBULATORY_CARE_PROVIDER_SITE_OTHER): Payer: PRIVATE HEALTH INSURANCE | Admitting: Family Medicine

## 2016-05-16 ENCOUNTER — Encounter: Payer: Self-pay | Admitting: Family Medicine

## 2016-05-16 VITALS — BP 110/68 | HR 64 | Temp 98.8°F | Resp 16 | Wt 246.0 lb

## 2016-05-16 DIAGNOSIS — I10 Essential (primary) hypertension: Secondary | ICD-10-CM

## 2016-05-16 DIAGNOSIS — F331 Major depressive disorder, recurrent, moderate: Secondary | ICD-10-CM | POA: Diagnosis not present

## 2016-05-16 MED ORDER — BUPROPION HCL ER (XL) 300 MG PO TB24: 300.0000 mg | ORAL_TABLET | Freq: Every day | ORAL | 3 refills | Status: DC

## 2016-05-16 NOTE — Progress Notes (Signed)
Patient: [name] Female    DOB: 1960/03/16   56 y.o.   MRN: [id number] Visit Date: 05/16/2016  Today's Provider: Mila Merry, MD   Chief Complaint  Patient presents with  . Hypertension  . Depression   Subjective:    HPI  Hypertension, follow-up:  BP Readings from Last 3 Encounters:  05/16/16 110/68  01/23/16 130/88  06/14/15 106/68    She was last seen for hypertension 1 years ago.  BP at that visit was 106/68. Management since that visit includes no changes. She reports good compliance with treatment. She is not having side effects.  She is not exercising. She is adherent to low salt diet.   Outside blood pressures are not being checked. She is experiencing lower extremity edema.  Patient denies exertional chest pressure/discomfort and palpitations.   Cardiovascular risk factors include obesity (BMI >= 30 kg/m2).     Weight trend: stable Wt Readings from Last 3 Encounters:  05/16/16 246 lb (111.6 kg)  01/23/16 248 lb (112.5 kg)  06/14/15 242 lb (109.8 kg)    Current diet: well balanced   Depression, follow up: Patient was last seen almost 1 year ago (by Dr. Elease Hashimoto). No changes were made in her medications. Patient is currently taking Bupropion , but reports that she only takes 1 tablet daily. Patient is requesting that we adjust her medication because she feels her depression is worsening. Patient reports that she sees her Delma Officer, her counselor in [place], once a month. She states she only take bupropion in the evening because she feels it makes her tired if she takes it in the morning.      No Known Allergies   Current Outpatient Prescriptions:  .  buPROPion (WELLBUTRIN SR) 150 MG 12 hr tablet, Take 1 tablet (150 mg total) by mouth 2 (two) times daily. 1 in a day for next 3 days and then 2 times daily, Disp: 60 tablet, Rfl: 5 .  ibuprofen (ADVIL,MOTRIN) 200 MG tablet, Take 200 mg by mouth every 6 (six) hours as needed., Disp: ,  Rfl:  .  methocarbamol (ROBAXIN) 500 MG tablet, Take 1 tablet (500 mg total) by mouth at bedtime., Disp: 30 tablet, Rfl: 5 .  naproxen sodium (ANAPROX) 220 MG tablet, Take 220 mg by mouth 2 (two) times daily as needed (for pain)., Disp: , Rfl:  .  triamterene-hydrochlorothiazide (MAXZIDE-25) 37.5-25 MG tablet, TAKE 1 TABLET BY MOUTH EVERYDAY, Disp: 90 tablet, Rfl: 1  Review of Systems  Respiratory: Negative.   Cardiovascular: Positive for leg swelling. Negative for chest pain and palpitations.  Neurological: Negative for dizziness, tremors, syncope, facial asymmetry, speech difficulty, weakness, light-headedness, numbness and headaches.  Psychiatric/Behavioral: Positive for agitation. The patient is nervous/anxious.     Social History  Substance Use Topics  . Smoking status: Never Smoker  . Smokeless tobacco: Never Used  . Alcohol use Yes     Comment: social   Objective:   BP 110/68 (BP Location: Left Arm, Patient Position: Sitting, Cuff Size: Large)   Pulse 64   Temp 98.8 F (37.1 C)   Resp 16   Wt 246 lb (111.6 kg)   SpO2 98%   BMI 45.36 kg/m     Physical Exam   General Appearance:    Alert, cooperative, no distress  Eyes:    PERRL, conjunctiva/corneas clear, EOM's intact       Lungs:     Clear to auscultation bilaterally, respirations unlabored  Heart:  Regular rate and rhythm  Neurologic:   Awake, alert, oriented x 3. No apparent focal neurological           defect.       Depression screen [facility] 2/9 06/14/2015 06/14/2015  Decreased Interest 2 0  Down, Depressed, Hopeless 2 0  PHQ - 2 Score 4 0  Altered sleeping 2 -  Tired, decreased energy 2 -  Change in appetite 2 -  Feeling bad or failure about yourself  0 -  Trouble concentrating 2 -  Moving slowly or fidgety/restless 0 -  Suicidal thoughts 0 -  PHQ-9 Score 12 -       Assessment & Plan:     1. Moderate episode of recurrent major depressive disorder (HCC) She is only t take  once a day but still  feeling depressed. Will change to 300 XL formulation and follow up in a month. Continue month follow up with Wallie Char in [place] - buPROPion (WELLBUTRIN XL) 300 MG 24 hr tablet; Take 1 tablet (300 mg total) by mouth daily.  Dispense: 30 tablet; Refill: 3  2. Essential hypertension Well controlled.  Continue current medications.         Mila Merry, MD  [facility]

## 2016-06-02 ENCOUNTER — Encounter: Payer: Self-pay | Admitting: Family Medicine

## 2016-06-02 ENCOUNTER — Ambulatory Visit (INDEPENDENT_AMBULATORY_CARE_PROVIDER_SITE_OTHER): Payer: PRIVATE HEALTH INSURANCE | Admitting: Family Medicine

## 2016-06-02 VITALS — BP 110/64 | HR 57 | Temp 98.5°F | Resp 16 | Ht 62.0 in | Wt 249.0 lb

## 2016-06-02 DIAGNOSIS — G8929 Other chronic pain: Secondary | ICD-10-CM | POA: Diagnosis not present

## 2016-06-02 DIAGNOSIS — F331 Major depressive disorder, recurrent, moderate: Secondary | ICD-10-CM

## 2016-06-02 DIAGNOSIS — M5440 Lumbago with sciatica, unspecified side: Secondary | ICD-10-CM

## 2016-06-02 MED ORDER — FLUOXETINE HCL 20 MG PO TABS: 20.0000 mg | ORAL_TABLET | Freq: Every day | ORAL | 3 refills | Status: DC

## 2016-06-02 NOTE — Progress Notes (Signed)
       Patient: [name] Female    DOB: 20-Nov-1960   56 y.o.   MRN: [id number] Visit Date: 06/02/2016  Today's Provider: Mila Merryonald Anabelle Bungert, MD   Chief Complaint  Patient presents with  . Follow-up  . Depression   Subjective:    HPI  Moderate episode of recurrent major depressive disorder (HCC) From 05/16/2016-changes made include increasing Wellbutrin to 300 XL formulation and follow up in a month. Continues to follow up [facility] [place]. She feels like the increase in wellbutrin has helped some. We also discussed adding SSRI at her last visit which she states she would like to try now.   She also has chronic mechanical back pain which occasional flares up and prevents her from working. She needs FMLA to cover her for occasionally missing work due to flare ups.    No Known Allergies   Current Outpatient Prescriptions:  .  buPROPion (WELLBUTRIN XL) 300 MG 24 hr tablet, Take 1 tablet (300 mg total) by mouth daily., Disp: 30 tablet, Rfl: 3 .  ibuprofen (ADVIL,MOTRIN) 200 MG tablet, Take 200 mg by mouth every 6 (six) hours as needed., Disp: , Rfl:  .  methocarbamol (ROBAXIN) 500 MG tablet, Take 1 tablet (500 mg total) by mouth at bedtime., Disp: 30 tablet, Rfl: 5 .  naproxen sodium (ANAPROX) 220 MG tablet, Take 220 mg by mouth 2 (two) times daily as needed (for pain)., Disp: , Rfl:  .  triamterene-hydrochlorothiazide (MAXZIDE-25) 37.5-25 MG tablet, TAKE 1 TABLET BY MOUTH EVERYDAY, Disp: 90 tablet, Rfl: 1  Review of Systems  Constitutional: Negative for appetite change, chills, fatigue and fever.  Respiratory: Negative for chest tightness and shortness of breath.   Cardiovascular: Negative for chest pain and palpitations.  Gastrointestinal: Negative for abdominal pain, nausea and vomiting.  Neurological: Negative for dizziness and weakness.    Social History  Substance Use Topics  . Smoking status: Never Smoker  . Smokeless tobacco: Never Used  . Alcohol use Yes   Comment: social   Objective:   BP 110/64 (BP Location: Right Arm, Patient Position: Sitting, Cuff Size: Large)   Pulse (!) 57   Temp 98.5 F (36.9 C) (Oral)   Resp 16   Ht 5\' 2"  (1.575 m)   Wt 249 lb (112.9 kg)   SpO2 96%   BMI 45.54 kg/m  Vitals:   06/02/16 1629  BP: 110/64  Pulse: (!) 57  Resp: 16  Temp: 98.5 F (36.9 C)  TempSrc: Oral  SpO2: 96%  Weight: 249 lb (112.9 kg)  Height: 5\' 2"  (1.575 m)     Physical Exam  General appearance: alert, well developed, well nourished, cooperative and in no distress Head: Normocephalic, without obvious abnormality, atraumatic Respiratory: Respirations even and unlabored, normal respiratory rate Extremities: No gross deformities Skin: Skin color, texture, turgor normal. No rashes seen  Psych: Appropriate mood and affect. Neurologic: Mental status: Alert, oriented to person, place, and time, thought content appropriate.     Assessment & Plan:     1. Moderate episode of recurrent major depressive disorder (HCC) Improving, but still not entirely functional.  - FLUoxetine (PROZAC) 20 MG tablet; Take 1 tablet (20 mg total) by mouth daily.  Dispense: 30 tablet; Refill: 3  2. 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       Mila Merryonald Kashlyn Salinas, MD  [facility]

## 2016-06-27 ENCOUNTER — Ambulatory Visit: Payer: PRIVATE HEALTH INSURANCE | Admitting: Family Medicine

## 2016-07-16 ENCOUNTER — Other Ambulatory Visit: Payer: Self-pay | Admitting: Family Medicine

## 2016-07-16 DIAGNOSIS — F331 Major depressive disorder, recurrent, moderate: Secondary | ICD-10-CM

## 2016-07-16 MED ORDER — BUPROPION HCL ER (XL) 300 MG PO TB24: 300.0000 mg | ORAL_TABLET | Freq: Every day | ORAL | 2 refills | Status: DC

## 2016-07-16 NOTE — Telephone Encounter (Signed)
[facility] faxed a refill request on the following medications:   buPROPion (WELLBUTRIN XL) 300 MG 24 hr tablet.  Take 1 tablet by mouth daily.  [facility] day supply.  [facility] [address]

## 2016-08-01 ENCOUNTER — Ambulatory Visit: Payer: PRIVATE HEALTH INSURANCE | Admitting: Family Medicine

## 2016-08-22 ENCOUNTER — Ambulatory Visit (INDEPENDENT_AMBULATORY_CARE_PROVIDER_SITE_OTHER): Payer: PRIVATE HEALTH INSURANCE | Admitting: Family Medicine

## 2016-08-22 ENCOUNTER — Encounter: Payer: Self-pay | Admitting: *Deleted

## 2016-08-22 ENCOUNTER — Encounter: Payer: Self-pay | Admitting: Family Medicine

## 2016-08-22 VITALS — BP 110/80 | HR 79 | Temp 98.8°F | Resp 16 | Wt 250.0 lb

## 2016-08-22 DIAGNOSIS — R05 Cough: Secondary | ICD-10-CM | POA: Diagnosis not present

## 2016-08-22 DIAGNOSIS — J069 Acute upper respiratory infection, unspecified: Secondary | ICD-10-CM | POA: Diagnosis not present

## 2016-08-22 DIAGNOSIS — R059 Cough, unspecified: Secondary | ICD-10-CM

## 2016-08-22 MED ORDER — HYDROCODONE-HOMATROPINE 5-1.5 MG/5ML PO SYRP: 5.0000 mL | ORAL_SOLUTION | Freq: Three times a day (TID) | ORAL | 0 refills | Status: DC | PRN

## 2016-08-22 NOTE — Patient Instructions (Addendum)

## 2016-08-22 NOTE — Progress Notes (Signed)
Patient: [name] Female    DOB: 1960-03-01   56 y.o.   MRN: [id number] Visit Date: 08/22/2016  Today's Provider: Mila Merryonald Fisher, MD   Chief Complaint  Patient presents with  . Sore Throat   Subjective:    Patient has had a sore throat and cough for 3 days. Cough is nonproductive. No fever. Patient states that her throat hurts when she swallows. Patient has not taken any otc medications.    Sore Throat   This is a new problem. The current episode started in the past 7 days (3 days ago). The problem has been unchanged. There has been no fever. The pain is at a severity of 3/10. The pain is mild. Associated symptoms include congestion, coughing, a hoarse voice and trouble swallowing. Pertinent negatives include no abdominal pain, diarrhea, drooling, ear discharge, ear pain, headaches, plugged ear sensation, neck pain, shortness of breath, stridor, swollen glands or vomiting. She has had no exposure to strep or mono. She has tried nothing for the symptoms.       No Known Allergies   Current Outpatient Prescriptions:  .  buPROPion (WELLBUTRIN XL) 300 MG 24 hr tablet, Take 1 tablet (300 mg total) by mouth daily., Disp: 90 tablet, Rfl: 2 .  FLUoxetine (PROZAC) 20 MG tablet, Take 1 tablet (20 mg total) by mouth daily., Disp: 30 tablet, Rfl: 3 .  ibuprofen (ADVIL,MOTRIN) 200 MG tablet, Take 200 mg by mouth every 6 (six) hours as needed., Disp: , Rfl:  .  methocarbamol (ROBAXIN) 500 MG tablet, Take 1 tablet (500 mg total) by mouth at bedtime., Disp: 30 tablet, Rfl: 5 .  triamterene-hydrochlorothiazide (MAXZIDE-25) 37.5-25 MG tablet, TAKE 1 TABLET BY MOUTH EVERYDAY, Disp: 90 tablet, Rfl: 1 .  naproxen sodium (ANAPROX) 220 MG tablet, Take 220 mg by mouth 2 (two) times daily as needed (for pain)., Disp: , Rfl:   Review of Systems  Constitutional: Negative for appetite change, chills, fatigue and fever.  HENT: Positive for congestion, hoarse voice and trouble swallowing. Negative  for drooling, ear discharge and ear pain.   Respiratory: Positive for cough. Negative for chest tightness, shortness of breath and stridor.   Cardiovascular: Negative for chest pain and palpitations.  Gastrointestinal: Negative for abdominal pain, diarrhea, nausea and vomiting.  Musculoskeletal: Negative for neck pain.  Neurological: Negative for dizziness, weakness and headaches.    Social History  Substance Use Topics  . Smoking status: Never Smoker  . Smokeless tobacco: Never Used  . Alcohol use Yes     Comment: social   Objective:   BP 110/80 (BP Location: Right Arm, Patient Position: Sitting, Cuff Size: Large)   Pulse 79   Temp 98.8 F (37.1 C) (Oral)   Resp 16   Wt 250 lb (113.4 kg)   SpO2 97%   BMI 45.73 kg/m  Vitals:   08/22/16 1501  BP: 110/80  Pulse: 79  Resp: 16  Temp: 98.8 F (37.1 C)  TempSrc: Oral  SpO2: 97%  Weight: 250 lb (113.4 kg)     Physical Exam  General Appearance:    Alert, cooperative, no distress  HENT:   bilateral TM normal without fluid or infection, neck has bilateral anterior cervical nodes enlarged, pharynx erythematous without exudate and sinuses nontender  Eyes:    PERRL, conjunctiva/corneas clear, EOM's intact       Lungs:     Clear to auscultation bilaterally, respirations unlabored  Heart:    Regular rate and rhythm  Neurologic:   Awake, alert, oriented x 3. No apparent focal neurological           defect.           Assessment & Plan:     1. Upper respiratory tract infection, unspecified type  2. Cough  - HYDROcodone-homatropine (HYCODAN) 5-1.5 MG/5ML syrup; Take 5 mLs by mouth every 8 (eight) hours as needed for cough.  Dispense: 120 mL; Refill: 0  Work excuse yesterday and today.  Call if symptoms change or if not rapidly improving.          Mila Merryonald Fisher, MD  [facility]

## 2016-10-27 ENCOUNTER — Encounter: Payer: Self-pay | Admitting: *Deleted

## 2016-12-16 ENCOUNTER — Encounter: Payer: Self-pay | Admitting: Family Medicine

## 2016-12-16 ENCOUNTER — Ambulatory Visit (INDEPENDENT_AMBULATORY_CARE_PROVIDER_SITE_OTHER): Payer: PRIVATE HEALTH INSURANCE | Admitting: Family Medicine

## 2016-12-16 VITALS — BP 120/80 | HR 62 | Temp 98.1°F | Resp 16 | Wt 248.0 lb

## 2016-12-16 DIAGNOSIS — G4700 Insomnia, unspecified: Secondary | ICD-10-CM

## 2016-12-16 DIAGNOSIS — F331 Major depressive disorder, recurrent, moderate: Secondary | ICD-10-CM

## 2016-12-16 MED ORDER — LORAZEPAM 1 MG PO TABS: 0.5000 mg | ORAL_TABLET | Freq: Every day | ORAL | 1 refills | Status: DC
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
## 2016-12-20 ENCOUNTER — Other Ambulatory Visit: Payer: Self-pay | Admitting: Physician Assistant

## 2016-12-20 DIAGNOSIS — I10 Essential (primary) hypertension: Secondary | ICD-10-CM

## 2017-02-13 ENCOUNTER — Encounter: Payer: Self-pay | Admitting: Family Medicine

## 2017-05-18 ENCOUNTER — Encounter: Payer: Self-pay | Admitting: Family Medicine

## 2017-05-18 ENCOUNTER — Ambulatory Visit: Payer: PRIVATE HEALTH INSURANCE | Admitting: Family Medicine

## 2017-05-18 ENCOUNTER — Other Ambulatory Visit: Payer: Self-pay

## 2017-05-18 VITALS — BP 118/80 | HR 92 | Temp 99.1°F | Wt 240.0 lb

## 2017-05-18 DIAGNOSIS — R509 Fever, unspecified: Secondary | ICD-10-CM

## 2017-05-18 DIAGNOSIS — G4700 Insomnia, unspecified: Secondary | ICD-10-CM | POA: Diagnosis not present

## 2017-05-18 DIAGNOSIS — J069 Acute upper respiratory infection, unspecified: Secondary | ICD-10-CM

## 2017-05-18 LAB — POC INFLUENZA A&B (BINAX/QUICKVUE)
INFLUENZA A, POC: NEGATIVE
Influenza B, POC: NEGATIVE

## 2017-05-18 MED ORDER — AZITHROMYCIN 250 MG PO TABS: ORAL_TABLET | ORAL | 0 refills | Status: AC

## 2017-05-18 MED ORDER — LORAZEPAM 2 MG PO TABS: 1.0000 mg | ORAL_TABLET | Freq: Every day | ORAL | 1 refills | Status: DC

## 2017-05-18 NOTE — Progress Notes (Signed)
Patient: [name] Female    DOB: [birth year]   57 y.o.   MRN: [id number] Visit Date: 05/18/2017  Today's Provider: Mila Merry, MD   Chief Complaint  Patient presents with  . Cough  . Sore Throat    started friday evening   Subjective:    Cough  Associated symptoms include a fever and a sore throat. Pertinent negatives include no chest pain, chills or shortness of breath.  Symptoms started as tickle in throat 3 evenings ago followed by cough and temperature of 100.5 Started with sore throat 2 days ago. Taken cold medicines yesterday.   She also requests refill for lorazepam which she takes occasionally for insomnia, but has been out of for several months. She reports it has been effective with no adverse effects or hangover effects.     No Known Allergies   Current Outpatient Medications:  .  triamterene-hydrochlorothiazide (MAXZIDE-25) 37.5-25 MG tablet, TAKE 1 TABLET BY MOUTH EVERYDAY, Disp: 90 tablet, Rfl: 4 .  ibuprofen (ADVIL,MOTRIN) 200 MG tablet, Take 200 mg by mouth every 6 (six) hours as needed., Disp: , Rfl:  .  LORazepam (ATIVAN) 1 MG tablet, Take 0.5-1 tablets (0.5-1 mg total) by mouth at bedtime. (Patient not taking: Reported on 05/18/2017), Disp: 30 tablet, Rfl: 1 .  methocarbamol (ROBAXIN) 500 MG tablet, Take 1 tablet (500 mg total) by mouth at bedtime. (Patient not taking: Reported on 05/18/2017), Disp: 30 tablet, Rfl: 5 .  naproxen sodium (ANAPROX) 220 MG tablet, Take 220 mg by mouth 2 (two) times daily as needed (for pain)., Disp: , Rfl:   Review of Systems  Constitutional: Positive for fever. Negative for appetite change, chills and fatigue.  HENT: Positive for sore throat.   Respiratory: Positive for cough. Negative for chest tightness and shortness of breath.   Cardiovascular: Negative for chest pain and palpitations.  Gastrointestinal: Negative for abdominal pain, nausea and vomiting.  Neurological: Negative for dizziness and weakness.     Social History   Tobacco Use  . Smoking status: Never Smoker  . Smokeless tobacco: Never Used  Substance Use Topics  . Alcohol use: Yes    Comment: social   Objective:   BP 118/80 (BP Location: Right Arm, Patient Position: Sitting, Cuff Size: Large)   Pulse 92   Temp 99.1 F (37.3 C) (Oral)   Wt 240 lb (108.9 kg)   SpO2 98%   BMI 43.90 kg/m  Vitals:   05/18/17 1428  BP: 118/80  Pulse: 92  Temp: 99.1 F (37.3 C)  TempSrc: Oral  SpO2: 98%  Weight: 240 lb (108.9 kg)     Physical Exam  General Appearance:    Alert, cooperative, no distress  HENT:   bilateral TM normal without fluid or infection, neck has bilateral anterior cervical nodes enlarged, sinuses nontender and nasal mucosa pale and congested  Eyes:    PERRL, conjunctiva/corneas clear, EOM's intact       Lungs:     Clear to auscultation bilaterally, respirations unlabored  Heart:    Regular rate and rhythm  Neurologic:   Awake, alert, oriented x 3. No apparent focal neurological           defect.       Results for orders placed or performed in visit on 05/18/17  POC Influenza A&B(BINAX/QUICKVUE)  Result Value Ref Range   Influenza A, POC Negative Negative   Influenza B, POC Negative Negative      Assessment & Plan:  1. Fever, unspecified fever cause  - POC Influenza A&B(BINAX/QUICKVUE)  2. Insomnia, unspecified type  - LORazepam (ATIVAN) 2 MG tablet; Take 0.5-1 tablets (1-2 mg total) by mouth at bedtime.  Dispense: 30 tablet; Refill: 1  3. Upper respiratory tract infection, unspecified type  - azithromycin (ZITHROMAX) 250 MG tablet; 2 by mouth today, then 1 daily for 4 days  Dispense: 6 tablet; Refill: 0       Mila Merryonald Kanye Depree, MD  [facility]

## 2017-07-03 ENCOUNTER — Encounter: Payer: Self-pay | Admitting: Family Medicine

## 2017-07-03 ENCOUNTER — Ambulatory Visit: Payer: PRIVATE HEALTH INSURANCE | Admitting: Family Medicine

## 2017-07-03 VITALS — BP 128/72 | HR 58 | Temp 98.2°F | Resp 16 | Wt 245.0 lb

## 2017-07-03 DIAGNOSIS — I890 Lymphedema, not elsewhere classified: Secondary | ICD-10-CM
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
## 2017-09-18 ENCOUNTER — Other Ambulatory Visit: Payer: Self-pay | Admitting: Family Medicine

## 2017-09-18 DIAGNOSIS — G4700 Insomnia, unspecified: Secondary | ICD-10-CM

## 2017-09-18 MED ORDER — LORAZEPAM 2 MG PO TABS: 1.0000 mg | ORAL_TABLET | Freq: Every day | ORAL | 3 refills | Status: DC

## 2017-09-18 NOTE — Telephone Encounter (Signed)
Pt requesting a refill on the following medication, Pt uses [facility]. Thanks [facility]  LORazepam (ATIVAN) 2 MG tablet

## 2017-10-26 ENCOUNTER — Encounter: Payer: Self-pay | Admitting: Family Medicine

## 2017-10-26 ENCOUNTER — Ambulatory Visit: Payer: PRIVATE HEALTH INSURANCE | Admitting: Family Medicine

## 2017-10-26 VITALS — BP 114/78 | HR 58 | Temp 98.3°F | Resp 16 | Wt 241.0 lb

## 2017-10-26 DIAGNOSIS — S8002XA Contusion of left knee, initial encounter: Secondary | ICD-10-CM

## 2017-10-26 DIAGNOSIS — S63502A Unspecified sprain of left wrist, initial encounter: Secondary | ICD-10-CM | POA: Diagnosis not present

## 2017-10-26 DIAGNOSIS — S46911A Strain of unspecified muscle, fascia and tendon at shoulder and upper arm level, right arm, initial encounter: Secondary | ICD-10-CM | POA: Diagnosis not present
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
## 2018-01-03 ENCOUNTER — Other Ambulatory Visit: Payer: Self-pay | Admitting: Family Medicine

## 2018-01-03 DIAGNOSIS — I10 Essential (primary) hypertension: Secondary | ICD-10-CM

## 2018-04-13 ENCOUNTER — Other Ambulatory Visit: Payer: Self-pay

## 2018-04-13 ENCOUNTER — Ambulatory Visit (INDEPENDENT_AMBULATORY_CARE_PROVIDER_SITE_OTHER): Payer: PRIVATE HEALTH INSURANCE | Admitting: Family Medicine

## 2018-04-13 ENCOUNTER — Encounter: Payer: Self-pay | Admitting: Family Medicine

## 2018-04-13 DIAGNOSIS — F5104 Psychophysiologic insomnia: Secondary | ICD-10-CM

## 2018-04-13 DIAGNOSIS — I890 Lymphedema, not elsewhere classified: Secondary | ICD-10-CM | POA: Diagnosis not present

## 2018-04-13 DIAGNOSIS — I10 Essential (primary) hypertension: Secondary | ICD-10-CM

## 2018-04-13 DIAGNOSIS — G4700 Insomnia, unspecified: Secondary | ICD-10-CM

## 2018-04-13 DIAGNOSIS — F331 Major depressive disorder, recurrent, moderate: Secondary | ICD-10-CM | POA: Insufficient documentation

## 2018-04-13 MED ORDER — DIAZEPAM 5 MG PO TABS: 5.0000 mg | ORAL_TABLET | Freq: Every day | ORAL | 1 refills | Status: DC
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
## 2018-04-13 NOTE — Patient Instructions (Signed)
.   Please review the attached list of medications and notify my office if there are any errors.   . Please bring all of your medications to every appointment so we can make sure that our medication list is the same as yours.   

## 2018-04-29 ENCOUNTER — Other Ambulatory Visit: Payer: Self-pay

## 2018-04-29 ENCOUNTER — Encounter: Payer: Self-pay | Admitting: Occupational Therapy

## 2018-04-29 ENCOUNTER — Ambulatory Visit: Payer: 59 | Attending: Family Medicine | Admitting: Occupational Therapy

## 2018-04-29 DIAGNOSIS — I890 Lymphedema, not elsewhere classified: Secondary | ICD-10-CM | POA: Diagnosis present

## 2018-04-29 DIAGNOSIS — M6281 Muscle weakness (generalized): Secondary | ICD-10-CM | POA: Diagnosis present
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
## 2018-04-29 NOTE — Patient Instructions (Signed)
Pt to get measured for compression sleeve - Elvarex soft sleeve and glove   HEP - GTB for shoulder retraction , extention  2 x 10 reps  Protraction /retraction supine - 5 lbs  2 x 10 reps  With her old compression sleeve on  And cont to use pump 2 x day for hour

## 2018-05-06 ENCOUNTER — Other Ambulatory Visit: Payer: Self-pay

## 2018-05-06 ENCOUNTER — Ambulatory Visit: Attending: Family Medicine | Admitting: Occupational Therapy

## 2018-05-06 DIAGNOSIS — M25511 Pain in right shoulder: Secondary | ICD-10-CM | POA: Insufficient documentation

## 2018-05-06 DIAGNOSIS — M6281 Muscle weakness (generalized): Secondary | ICD-10-CM | POA: Diagnosis present

## 2018-05-06 DIAGNOSIS — I890 Lymphedema, not elsewhere classified: Secondary | ICD-10-CM

## 2018-05-06 NOTE — Patient Instructions (Signed)
Cont with GTB for scapula retraction , shoulder extention  2 x 10 reps   1 x day  Upper traps stretch for R and Scalenes stretch  10 reps hold 5 sec - after shower  Can do ice  And have friend or family massage upper traps   Cont with old compression sleeve and pump 2 x day 45 min to hour
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
## 2018-05-12 ENCOUNTER — Telehealth: Payer: Self-pay | Admitting: Family Medicine

## 2018-05-12 NOTE — Telephone Encounter (Signed)
[facility] received an order yesterday for compression arm sleeve and hand garment for patient but they need office notes to go along with the order.  Fax number [phone]  thanks Barth Kirks

## 2018-05-12 NOTE — Telephone Encounter (Signed)
Is it okay to send the note from 04/13/2018?  Thanks,   -Vernona Rieger

## 2018-05-13 NOTE — Telephone Encounter (Signed)
Printed off note to fax to [facility]

## 2018-05-13 NOTE — Telephone Encounter (Signed)
That's fine

## 2018-05-17 ENCOUNTER — Ambulatory Visit: Payer: 59 | Admitting: Occupational Therapy

## 2018-05-25 ENCOUNTER — Ambulatory Visit: Payer: 59 | Admitting: Occupational Therapy

## 2018-06-11 ENCOUNTER — Telehealth: Payer: Self-pay | Admitting: *Deleted

## 2018-06-11 NOTE — Telephone Encounter (Signed)
[facility] called office requesting the last ov note pt was seen for Lymphedema be faxed to them at [phone].

## 2018-06-12 NOTE — Telephone Encounter (Signed)
Patient needs to sign release of information.

## 2018-06-16 NOTE — Telephone Encounter (Signed)
Tried calling [facility] to advise them as below. Left message to call back. The number below is their fax number, so I had to search in a previous message from 05/12/2018 to find their phone number.  That message has documentation that Desha printed an office note to fax to [facility].

## 2018-06-23 ENCOUNTER — Encounter: Payer: Self-pay | Admitting: Physician Assistant

## 2018-06-23 ENCOUNTER — Other Ambulatory Visit: Payer: Self-pay

## 2018-06-23 ENCOUNTER — Ambulatory Visit: Payer: PRIVATE HEALTH INSURANCE | Admitting: Physician Assistant

## 2018-06-23 VITALS — BP 116/80 | HR 60 | Temp 98.1°F | Wt 233.0 lb

## 2018-06-23 DIAGNOSIS — I890 Lymphedema, not elsewhere classified: Secondary | ICD-10-CM | POA: Diagnosis not present

## 2018-06-23 NOTE — Patient Instructions (Signed)
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
## 2018-08-23 ENCOUNTER — Other Ambulatory Visit: Payer: Self-pay

## 2018-08-23 ENCOUNTER — Telehealth: Payer: Self-pay

## 2018-08-23 DIAGNOSIS — G4700 Insomnia, unspecified: Secondary | ICD-10-CM

## 2018-08-23 MED ORDER — DIAZEPAM 5 MG PO TABS: 5.0000 mg | ORAL_TABLET | Freq: Every day | ORAL | 1 refills | Status: DC

## 2018-08-23 NOTE — Telephone Encounter (Signed)
error 

## 2018-10-25 ENCOUNTER — Other Ambulatory Visit: Payer: Self-pay | Admitting: Family Medicine

## 2018-10-25 DIAGNOSIS — I10 Essential (primary) hypertension: Secondary | ICD-10-CM

## 2018-10-26 ENCOUNTER — Ambulatory Visit: Payer: PRIVATE HEALTH INSURANCE | Admitting: Family Medicine

## 2018-10-26 ENCOUNTER — Encounter: Payer: Self-pay | Admitting: Family Medicine

## 2018-10-26 ENCOUNTER — Other Ambulatory Visit: Payer: Self-pay

## 2018-10-26 VITALS — BP 110/70 | HR 55 | Temp 96.6°F | Resp 16 | Wt 232.0 lb

## 2018-10-26 DIAGNOSIS — I890 Lymphedema, not elsewhere classified: Secondary | ICD-10-CM | POA: Diagnosis not present

## 2018-10-26 DIAGNOSIS — F514 Sleep terrors [night terrors]: Secondary | ICD-10-CM | POA: Diagnosis not present

## 2018-10-26 DIAGNOSIS — F4310 Post-traumatic stress disorder, unspecified: Secondary | ICD-10-CM | POA: Diagnosis not present

## 2018-10-26 MED ORDER — PAROXETINE HCL 20 MG PO TABS: 20.0000 mg | ORAL_TABLET | Freq: Every day | ORAL | 1 refills | Status: DC

## 2018-10-26 NOTE — Progress Notes (Signed)
Patient: [name] Female    DOB: [birth year]   58 y.o.   MRN: [id number] Visit Date: 10/26/2018  Today's Provider: Lelon Huh, MD   Chief Complaint  Patient presents with  . Lymphedema  . Post-Traumatic Stress Disorder   Subjective:     HPI  Follow up for  PTSD  The patient was last seen for this >1-2 years ago.   She reports excellent compliance with treatment. She feels that condition is Worse. Patient reports today more recurrent nightmares triggering her PTSD, patient states that she experiences nightmares at least 2-3x a week She is not having side effects.Patient reports that she has been taking 2 5 mg Valium at bedtime but she feels that medication is no longer effective.Previously tried lorazepam and alprazolam which initially helped, but also seemed to stop working after awhile   Follow up for Lymphedema of arm  The patient was last seen for this 4 months ago. Changes made at last visit include referral to PT.   She feels that condition is Unchanged.   ------------------------------------------------------------------------------------  No Known Allergies   Current Outpatient Medications:  .  diazepam (VALIUM) 5 MG tablet, Take 1-2 tablets (5-10 mg total) by mouth at bedtime., Disp: 60 tablet, Rfl: 1 .  ibuprofen (ADVIL,MOTRIN) 200 MG tablet, Take 200 mg by mouth every 6 (six) hours as needed., Disp: , Rfl:  .  naproxen sodium (ANAPROX) 220 MG tablet, Take 220 mg by mouth 2 (two) times daily as needed (for pain)., Disp: , Rfl:  .  triamterene-hydrochlorothiazide (MAXZIDE-25) 37.5-25 MG tablet, TAKE 1 TABLET BY MOUTH EVERY DAY, Disp: 90 tablet, Rfl: 2  Review of Systems  Constitutional: Negative for appetite change, chills, fatigue and fever.  Respiratory: Negative for chest tightness and shortness of breath.   Cardiovascular: Negative for chest pain and palpitations.  Gastrointestinal: Negative for abdominal pain, nausea and vomiting.   Neurological: Negative for dizziness and weakness.    Social History   Tobacco Use  . Smoking status: Never Smoker  . Smokeless tobacco: Never Used  Substance Use Topics  . Alcohol use: Yes    Comment: social      Objective:   BP 110/70   Pulse (!) 55   Temp (!) 96.6 F (35.9 C) (Oral)   Resp 16   Wt 232 lb (105.2 kg)   SpO2 97%   BMI 42.43 kg/m  Vitals:   10/26/18 1055  BP: 110/70  Pulse: (!) 55  Resp: 16  Temp: (!) 96.6 F (35.9 C)  TempSrc: Oral  SpO2: 97%  Weight: 232 lb (105.2 kg)  Body mass index is 42.43 kg/m.   Physical Exam  General appearance: Severely obese female, cooperative and in no acute distress Head: Normocephalic, without obvious abnormality, atraumatic Respiratory: Respirations even and unlabored, normal respiratory rate Extremities: All extremities are intact. Right UE moderately swollen Skin: Skin color, texture, turgor normal. No rashes seen  Psych: Appropriate mood and affect. Neurologic: Mental status: Alert, oriented to person, place, and time, thought content appropriate.     Assessment & Plan    1. PTSD (post-traumatic stress disorder) Primary symptoms is night terrors as below.  - PARoxetine (PAXIL) 20 MG tablet; Take 1 tablet (20 mg total) by mouth daily. Take 1/2 tablet at night for six days, then increase to 1 tablet at night  Dispense: 30 tablet; Refill: 1  2. Night terrors, secondary No longer responded to benzodiazepines. try- PARoxetine (PAXIL) 20 MG tablet; Take  1 tablet (20 mg total) by mouth daily. Take 1/2 tablet at night for six days, then increase to 1 tablet at night  Dispense: 30 tablet; Refill: 1  Consider trazodone  Follow up in 4-6 weeks.   3. Lymphedema of arm Chronic post surgical, already using compressible sleepy and vigilant about keeping arm elevated. Offered vascular referral, but she feels she can continue current management for now and will call if she changes her mind.      Mila Merry, MD   [facility]

## 2018-10-26 NOTE — Patient Instructions (Signed)
.   Please review the attached list of medications and notify my office if there are any errors.   . Please bring all of your medications to every appointment so we can make sure that our medication list is the same as yours.   . It is especially important to get the annual flu vaccine this year. If you haven't had it already, please go to your pharmacy or call the office as soon as possible to schedule you flu shot.  

## 2018-11-17 ENCOUNTER — Other Ambulatory Visit: Payer: Self-pay | Admitting: Family Medicine

## 2018-11-17 DIAGNOSIS — F4310 Post-traumatic stress disorder, unspecified: Secondary | ICD-10-CM

## 2018-11-17 DIAGNOSIS — F514 Sleep terrors [night terrors]: Secondary | ICD-10-CM

## 2018-12-03 ENCOUNTER — Ambulatory Visit: Payer: Self-pay | Admitting: Family Medicine

## 2019-02-22 ENCOUNTER — Telehealth: Payer: Self-pay

## 2019-02-22 NOTE — Telephone Encounter (Signed)
Copied from CRM [id number]. Topic: General - Other >> Feb 22, 2019  3:49 PM Dalphine Handing A wrote: Patient would like a callback today with a status update on her fmla paperwork. Patient wanting to pick up paperwork today. Please advise

## 2019-02-23 NOTE — Telephone Encounter (Signed)
20 minutes if fine. Thanks

## 2019-02-23 NOTE — Telephone Encounter (Signed)
Patient advised that she needs an appointment in order for forms to be completed.   Dr. Sherrie Mustache, do you need 40 minute slot with this patient? She has a deadline to turn these papers in and she wants to be seen asap. She doesn't have transportation on Mondays or Fridays. Can we work her in sometime next week?

## 2019-02-23 NOTE — Telephone Encounter (Signed)
Appointment scheduled.

## 2019-03-01 ENCOUNTER — Other Ambulatory Visit: Payer: Self-pay

## 2019-03-01 ENCOUNTER — Ambulatory Visit: Payer: PRIVATE HEALTH INSURANCE | Admitting: Family Medicine

## 2019-03-01 VITALS — BP 116/72 | HR 71 | Temp 96.8°F | Ht 61.25 in | Wt 237.0 lb

## 2019-03-01 DIAGNOSIS — I890 Lymphedema, not elsewhere classified: Secondary | ICD-10-CM

## 2019-03-01 DIAGNOSIS — G4700 Insomnia, unspecified: Secondary | ICD-10-CM

## 2019-03-01 MED ORDER — DIAZEPAM 5 MG PO TABS: 5.0000 mg | ORAL_TABLET | Freq: Every day | ORAL | 1 refills | Status: DC
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
## 2019-03-22 ENCOUNTER — Encounter: Payer: PRIVATE HEALTH INSURANCE | Admitting: Adult Health

## 2019-04-05 ENCOUNTER — Encounter: Payer: PRIVATE HEALTH INSURANCE | Admitting: Family Medicine

## 2019-05-16 ENCOUNTER — Ambulatory Visit: Payer: PRIVATE HEALTH INSURANCE | Attending: Internal Medicine

## 2019-05-16 DIAGNOSIS — Z23 Encounter for immunization: Secondary | ICD-10-CM

## 2019-05-16 NOTE — Progress Notes (Signed)
   Covid-19 Vaccination Clinic  Name:  [name]    MRN: [id number] DOB: Feb 17, 1960  05/16/2019  [name] was observed post Covid-19 immunization for 15 minutes without incident. She was provided with Vaccine Information Sheet and instruction to access the V-Safe system.   [name] was instructed to call 911 with any severe reactions post vaccine: Marland Kitchen Difficulty breathing  . Swelling of face and throat  . A fast heartbeat  . A bad rash all over body  . Dizziness and weakness   Immunizations Administered    Name Date Dose VIS Date Route   Pfizer COVID-19 Vaccine 05/16/2019 10:53 AM 0.3 mL 03/23/2018 Intramuscular   Manufacturer: [employer], [employer]   Lot: [id number]   NDC: [address]-8

## 2019-05-18 ENCOUNTER — Other Ambulatory Visit: Payer: Self-pay | Admitting: Family Medicine

## 2019-05-18 DIAGNOSIS — G4700 Insomnia, unspecified: Secondary | ICD-10-CM

## 2019-05-18 MED ORDER — DIAZEPAM 5 MG PO TABS: 5.0000 mg | ORAL_TABLET | Freq: Every day | ORAL | 1 refills | Status: DC
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
## 2019-05-18 NOTE — Telephone Encounter (Signed)
Medication Refill - Medication: diazepam (VALIUM) 5 MG tablet   Has the patient contacted their pharmacy? Yes.   (Agent: If no, request that the patient contact the pharmacy for the refill.) (Agent: If yes, when and what did the pharmacy advise?)  Preferred Pharmacy (with phone number or street name):  [id number] [place], [place] - [address] [place] [address]  Phone: [phone] Fax: [phone]     Agent: Please be advised that RX refills may take up to 3 business days. We ask that you follow-up with your pharmacy.

## 2019-06-14 ENCOUNTER — Ambulatory Visit: Payer: Self-pay

## 2019-06-15 ENCOUNTER — Ambulatory Visit: Payer: Self-pay

## 2019-06-16 ENCOUNTER — Ambulatory Visit: Payer: PRIVATE HEALTH INSURANCE | Attending: Internal Medicine

## 2019-06-16 DIAGNOSIS — Z23 Encounter for immunization: Secondary | ICD-10-CM

## 2019-06-16 NOTE — Progress Notes (Signed)
   Covid-19 Vaccination Clinic  Name:  ROSANA FARNELL    MRN: [id number] DOB: 03/24/1960  06/16/2019  Ms. Walsworth was observed post Covid-19 immunization for 15 minutes without incident. She was provided with Vaccine Information Sheet and instruction to access the V-Safe system.   Ms. Mancebo was instructed to call 911 with any severe reactions post vaccine: Marland Kitchen Difficulty breathing  . Swelling of face and throat  . A fast heartbeat  . A bad rash all over body  . Dizziness and weakness   Immunizations Administered    Name Date Dose VIS Date Route   Pfizer COVID-19 Vaccine 06/16/2019  8:23 AM 0.3 mL 03/23/2018 Intramuscular   Manufacturer: [employer], [employer]   Lot: [id number]   NDC: [address]-1

## 2019-07-28 ENCOUNTER — Other Ambulatory Visit: Payer: Self-pay | Admitting: Family Medicine

## 2019-07-28 DIAGNOSIS — I10 Essential (primary) hypertension: Secondary | ICD-10-CM

## 2019-07-28 NOTE — Telephone Encounter (Signed)
Requested  medications are  due for refill today yes  Requested medications are on the active medication list yes  Last refill 4/1  Future visit scheduled no  Notes to clinic Failed protocol, no pertinent visit within 6 months

## 2019-08-12 ENCOUNTER — Telehealth: Payer: Self-pay

## 2019-08-12 NOTE — Telephone Encounter (Signed)
Copied from CRM [id number]. Topic: General - Other >> Aug 12, 2019  4:26 PM Elliot Gault wrote: [name] name: [name]  Relation to pt: from Freida Busman. [place] Call back number: [phone] fax # [phone]    Reason for call:   Medical records request was faxed over several months ago to fax # [phone], please note if received. Requested the fax to be re fax and the rep stated it was faxed already 2x

## 2019-08-16 NOTE — Telephone Encounter (Signed)
Spoke with Galen Manila advised that faxed was received and [employer] processed it on 08/03/2019. Galen Manila stated she had called 08/15/2019 and spoke to someone that provided her with Ervin's number. Thanks TNP

## 2019-11-20 ENCOUNTER — Other Ambulatory Visit: Payer: Self-pay | Admitting: Family Medicine

## 2019-11-20 DIAGNOSIS — F514 Sleep terrors [night terrors]: Secondary | ICD-10-CM

## 2019-11-20 DIAGNOSIS — F4310 Post-traumatic stress disorder, unspecified: Secondary | ICD-10-CM

## 2020-01-25 ENCOUNTER — Other Ambulatory Visit: Payer: Self-pay | Admitting: Family Medicine

## 2020-01-25 DIAGNOSIS — I10 Essential (primary) hypertension: Secondary | ICD-10-CM

## 2020-01-25 NOTE — Telephone Encounter (Signed)
Courtesy refill. Sent message to make appointment. 

## 2020-02-17 ENCOUNTER — Other Ambulatory Visit: Payer: Self-pay | Admitting: Family Medicine

## 2020-02-17 DIAGNOSIS — I10 Essential (primary) hypertension: Secondary | ICD-10-CM

## 2020-02-22 ENCOUNTER — Ambulatory Visit: Payer: PRIVATE HEALTH INSURANCE | Admitting: Family Medicine

## 2020-02-22 ENCOUNTER — Other Ambulatory Visit: Payer: Self-pay

## 2020-02-22 ENCOUNTER — Encounter: Payer: Self-pay | Admitting: Family Medicine

## 2020-02-22 VITALS — BP 128/84 | HR 57 | Temp 97.3°F | Resp 16 | Ht 61.25 in | Wt 234.0 lb

## 2020-02-22 DIAGNOSIS — F4310 Post-traumatic stress disorder, unspecified: Secondary | ICD-10-CM

## 2020-02-22 DIAGNOSIS — H6123 Impacted cerumen, bilateral: Secondary | ICD-10-CM

## 2020-02-22 DIAGNOSIS — F331 Major depressive disorder, recurrent, moderate: Secondary | ICD-10-CM | POA: Diagnosis not present

## 2020-02-22 DIAGNOSIS — F514 Sleep terrors [night terrors]: Secondary | ICD-10-CM

## 2020-02-22 DIAGNOSIS — F5104 Psychophysiologic insomnia: Secondary | ICD-10-CM

## 2020-02-22 DIAGNOSIS — I890 Lymphedema, not elsewhere classified: Secondary | ICD-10-CM

## 2020-02-22 DIAGNOSIS — Z1211 Encounter for screening for malignant neoplasm of colon: Secondary | ICD-10-CM

## 2020-02-22 DIAGNOSIS — I10 Essential (primary) hypertension: Secondary | ICD-10-CM

## 2020-02-22 MED ORDER — DIAZEPAM 5 MG PO TABS: 5.0000 mg | ORAL_TABLET | Freq: Every day | ORAL | 2 refills | Status: DC
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
## 2020-02-22 NOTE — Patient Instructions (Addendum)
.   You can use OTC Debrox ear drops to dissolve the wax in your ears  . Please call the [facility] [phone]) to schedule a routine screening mammogram.

## 2020-02-23 LAB — COMPREHENSIVE METABOLIC PANEL
ALT: 13 IU/L (ref 0–32)
AST: 14 IU/L (ref 0–40)
Albumin/Globulin Ratio: 1.5 (ref 1.2–2.2)
Albumin: 4.1 g/dL (ref 3.8–4.8)
Alkaline Phosphatase: 73 IU/L (ref 44–121)
BUN/Creatinine Ratio: 13 (ref 12–28)
BUN: 9 mg/dL (ref 8–27)
Bilirubin Total: 0.4 mg/dL (ref 0.0–1.2)
CO2: 27 mmol/L (ref 20–29)
Calcium: 9.8 mg/dL (ref 8.7–10.3)
Chloride: 100 mmol/L (ref 96–106)
Creatinine, Ser: 0.68 mg/dL (ref 0.57–1.00)
GFR calc Af Amer: 109 mL/min/{1.73_m2} (ref >59)
GFR calc non Af Amer: 95 mL/min/{1.73_m2} (ref >59)
Globulin, Total: 2.8 g/dL (ref 1.5–4.5)
Glucose: 109 mg/dL — ABNORMAL HIGH (ref 65–99)
Potassium: 3.8 mmol/L (ref 3.5–5.2)
Sodium: 141 mmol/L (ref 134–144)
Total Protein: 6.9 g/dL (ref 6.0–8.5)

## 2020-02-23 LAB — LIPID PANEL
Chol/HDL Ratio: 3.6 ratio (ref 0.0–4.4)
Cholesterol, Total: 215 mg/dL — ABNORMAL HIGH (ref 100–199)
HDL: 60 mg/dL (ref >39)
LDL Chol Calc (NIH): 144 mg/dL — ABNORMAL HIGH (ref 0–99)
Triglycerides: 61 mg/dL (ref 0–149)
VLDL Cholesterol Cal: 11 mg/dL (ref 5–40)

## 2020-02-23 LAB — CBC
Hematocrit: 42.8 % (ref 34.0–46.6)
Hemoglobin: 14.6 g/dL (ref 11.1–15.9)
MCH: 29.5 pg (ref 26.6–33.0)
MCHC: 34.1 g/dL (ref 31.5–35.7)
MCV: 87 fL (ref 79–97)
Platelets: 224 10*3/uL (ref 150–450)
RBC: 4.95 x10E6/uL (ref 3.77–5.28)
RDW: 12.1 % (ref 11.7–15.4)
WBC: 4 10*3/uL (ref 3.4–10.8)

## 2020-02-23 LAB — TSH: TSH: 1.03 u[IU]/mL (ref 0.450–4.500)

## 2020-03-05 ENCOUNTER — Other Ambulatory Visit: Payer: Self-pay | Admitting: Family Medicine

## 2020-03-05 DIAGNOSIS — I10 Essential (primary) hypertension: Secondary | ICD-10-CM

## 2020-03-08 ENCOUNTER — Telehealth: Payer: Self-pay

## 2020-03-08 DIAGNOSIS — I10 Essential (primary) hypertension: Secondary | ICD-10-CM

## 2020-03-08 NOTE — Telephone Encounter (Signed)
Copied from CRM [id number]. Topic: General - Other >> Mar 08, 2020  2:18 PM Jaquita Rector A wrote: Reason for CRM: Patient called in to inquire of Dr Sherrie Mustache why there are no refills on her triamterene-hydrochlorothiazide (MAXZIDE-25) 37.5-25 MG tablet. Wass sent to the pharmacy on 03/07/20 but with zero refills asking for Dr Sherrie Mustache to please send additional refills to her pharmacy Ph# [phone]

## 2020-03-09 MED ORDER — TRIAMTERENE-HCTZ 37.5-25 MG PO TABS: 1.0000 | ORAL_TABLET | Freq: Every day | ORAL | 1 refills | Status: DC

## 2020-03-09 NOTE — Addendum Note (Signed)
Addended by: Kavin Leech E on: 03/09/2020 09:05 AM   Modules accepted: Orders

## 2020-03-09 NOTE — Telephone Encounter (Signed)
RX sent to [facility]. Pt advised.   Thanks,   -Jacquie Lukes  

## 2020-05-18 ENCOUNTER — Other Ambulatory Visit: Payer: Self-pay | Admitting: Family Medicine

## 2020-05-18 DIAGNOSIS — F4310 Post-traumatic stress disorder, unspecified: Secondary | ICD-10-CM

## 2020-05-18 DIAGNOSIS — F514 Sleep terrors [night terrors]: Secondary | ICD-10-CM

## 2020-08-15 ENCOUNTER — Other Ambulatory Visit: Payer: Self-pay | Admitting: Family Medicine

## 2020-08-15 DIAGNOSIS — F4310 Post-traumatic stress disorder, unspecified: Secondary | ICD-10-CM

## 2020-08-15 DIAGNOSIS — F514 Sleep terrors [night terrors]: Secondary | ICD-10-CM

## 2020-08-15 NOTE — Telephone Encounter (Signed)
Patient needs an office visit for further refills. Requested Prescriptions  Pending Prescriptions Disp Refills  . PARoxetine (PAXIL) 20 MG tablet [Pharmacy Med Name: PAROXETINE HCL 20 MG TABLET] 30 tablet 0    Sig: TAKE 1 TABLET BY MOUTH DAILY. TAKE 1/2 TAB AT NIGHT FOR SIX DAYS, THEN INCREASE TO 1 TABLET AT NIGHT     Psychiatry:  Antidepressants - SSRI Passed - 08/15/2020  1:44 AM      Passed - Completed PHQ-2 or PHQ-9 in the last 360 days      Passed - Valid encounter within last 6 months    Recent Outpatient Visits          5 months ago Primary hypertension   [facility] Malva Limes, MD   1 year ago Lymphedema of arm   [facility] Malva Limes, MD   1 year ago PTSD (post-traumatic stress disorder)   [facility] Malva Limes, MD   2 years ago Lymphedema of arm   [facility] Osvaldo Angst M, [place]   2 years ago Lymphedema of arm   [facility] Malva Limes, MD

## 2020-09-09 ENCOUNTER — Other Ambulatory Visit: Payer: Self-pay | Admitting: Family Medicine

## 2020-09-09 DIAGNOSIS — F4310 Post-traumatic stress disorder, unspecified: Secondary | ICD-10-CM

## 2020-09-09 DIAGNOSIS — F514 Sleep terrors [night terrors]: Secondary | ICD-10-CM

## 2020-09-23 ENCOUNTER — Other Ambulatory Visit: Payer: Self-pay | Admitting: Family Medicine

## 2020-09-23 DIAGNOSIS — I10 Essential (primary) hypertension: Secondary | ICD-10-CM

## 2020-09-23 NOTE — Telephone Encounter (Signed)
Requested Prescriptions  Pending Prescriptions Disp Refills  . triamterene-hydrochlorothiazide (MAXZIDE-25) 37.5-25 MG tablet [Pharmacy Med Name: TRIAMTERENE-HCTZ 37.5-25 MG TB] 30 tablet 0    Sig: TAKE 1 TABLET BY MOUTH EVERY DAY     Cardiovascular: Diuretic Combos Failed - 09/23/2020  2:42 PM      Failed - Valid encounter within last 6 months    Recent Outpatient Visits          7 months ago Primary hypertension   [facility] Malva Limes, MD   1 year ago Lymphedema of arm   [facility] Malva Limes, MD   1 year ago PTSD (post-traumatic stress disorder)   [facility] Malva Limes, MD   2 years ago Lymphedema of arm   [facility] Osvaldo Angst M, [place]   2 years ago Lymphedema of arm   [facility] Malva Limes, MD             Passed - K in normal range and within 360 days    Potassium  Date Value Ref Range Status  02/22/2020 3.8 3.5 - 5.2 mmol/L Final         Passed - Na in normal range and within 360 days    Sodium  Date Value Ref Range Status  02/22/2020 141 134 - 144 mmol/L Final         Passed - Cr in normal range and within 360 days    Creatinine, Ser  Date Value Ref Range Status  02/22/2020 0.68 0.57 - 1.00 mg/dL Final         Passed - Ca in normal range and within 360 days    Calcium  Date Value Ref Range Status  02/22/2020 9.8 8.7 - 10.3 mg/dL Final         Passed - Last BP in normal range    BP Readings from Last 1 Encounters:  02/22/20 128/84          Called pt and advised her that she is overdue an appointment. Pt refused to make appt stating she will call back this week to schedule.

## 2020-10-03 ENCOUNTER — Telehealth: Payer: Self-pay | Admitting: Family Medicine

## 2020-10-03 ENCOUNTER — Other Ambulatory Visit: Payer: Self-pay | Admitting: Family Medicine

## 2020-10-03 DIAGNOSIS — I10 Essential (primary) hypertension: Secondary | ICD-10-CM

## 2020-10-03 NOTE — Telephone Encounter (Signed)
Medication Refill - Medication: triamterene-hydrochlorothiazide (MAXZIDE-25) 37.5-25 MG tablet  Patient requesting 90 day suppply as usua and has appt scheduled for 09/14  Has the patient contacted their pharmacy? yes (Agent: If no, request that the patient contact the pharmacy for the refill.) (Agent: If yes, when and what did the pharmacy advise?)  Preferred Pharmacy (with phone number or street name):  [id number] [place], [place] [address] Phone:  [phone]  Fax:  [phone]      Agent: Please be advised that RX refills may take up to 3 business days. We ask that you follow-up with your pharmacy.

## 2020-10-03 NOTE — Telephone Encounter (Signed)
Patient called to advised the reason for the 30 day courtesy refill, she says she understands and has an appointment on 10/10/20. She says she already spoke to someone in the office and was told the 90 day supply will be sent in. I advised it has to be approved by Dr. Sherrie Mustache before it can be sent in, so it was routed to him for approval. She says her insurance pays for 90 days and it was going to be $12 for 30 days, so she did not pick it up. I advised in order to receive refills as desired she has to follow up as indicated by the provider, she verbalized understanding.

## 2020-10-03 NOTE — Telephone Encounter (Signed)
Requested medication (s) are due for refill today - no  Requested medication (s) are on the active medication list -yes  Future visit scheduled -yes  Last refill: 09/23/20  Notes to clinic: Pharmacy requesting 90 day supply on courtesy RF- sent for provider review    Requested Prescriptions  Pending Prescriptions Disp Refills   triamterene-hydrochlorothiazide (MAXZIDE-25) 37.5-25 MG tablet [Pharmacy Med Name: TRIAMTERENE-HCTZ 37.5-25 MG TB] 30 tablet 0    Sig: TAKE 1 TABLET BY MOUTH EVERY DAY (MAX UNTIL VISIT, INS WILL NOT COVER UNLESS 90)     Cardiovascular: Diuretic Combos Failed - 10/03/2020  3:33 PM      Failed - Valid encounter within last 6 months    Recent Outpatient Visits           7 months ago Primary hypertension   [facility] Malva Limes, MD   1 year ago Lymphedema of arm   [facility] Malva Limes, MD   1 year ago PTSD (post-traumatic stress disorder)   [facility] Malva Limes, MD   2 years ago Lymphedema of arm   [facility] Osvaldo Angst M, [place]   2 years ago Lymphedema of arm   [facility] Malva Limes, MD       Future Appointments             In 1 week Fisher, Demetrios Isaacs, MD [facility]            Passed - K in normal range and within 360 days    Potassium  Date Value Ref Range Status  02/22/2020 3.8 3.5 - 5.2 mmol/L Final          Passed - Na in normal range and within 360 days    Sodium  Date Value Ref Range Status  02/22/2020 141 134 - 144 mmol/L Final          Passed - Cr in normal range and within 360 days    Creatinine, Ser  Date Value Ref Range Status  02/22/2020 0.68 0.57 - 1.00 mg/dL Final          Passed - Ca in normal range and within 360 days    Calcium  Date Value Ref Range Status  02/22/2020 9.8 8.7 - 10.3 mg/dL Final          Passed - Last BP in normal range    BP Readings from Last 1 Encounters:   02/22/20 128/84             Requested Prescriptions  Pending Prescriptions Disp Refills   triamterene-hydrochlorothiazide (MAXZIDE-25) 37.5-25 MG tablet [Pharmacy Med Name: TRIAMTERENE-HCTZ 37.5-25 MG TB] 30 tablet 0    Sig: TAKE 1 TABLET BY MOUTH EVERY DAY (MAX UNTIL VISIT, INS WILL NOT COVER UNLESS 90)     Cardiovascular: Diuretic Combos Failed - 10/03/2020  3:33 PM      Failed - Valid encounter within last 6 months    Recent Outpatient Visits           7 months ago Primary hypertension   [facility] Malva Limes, MD   1 year ago Lymphedema of arm   [facility] Malva Limes, MD   1 year ago PTSD (post-traumatic stress disorder)   [facility] Malva Limes, MD   2 years ago Lymphedema of arm   [facility] Osvaldo Angst M, [place]   2 years ago Lymphedema of  arm   [facility] Malva Limes, MD       Future Appointments             In 1 week Sherrie Mustache, Demetrios Isaacs, MD [facility]            Passed - K in normal range and within 360 days    Potassium  Date Value Ref Range Status  02/22/2020 3.8 3.5 - 5.2 mmol/L Final          Passed - Na in normal range and within 360 days    Sodium  Date Value Ref Range Status  02/22/2020 141 134 - 144 mmol/L Final          Passed - Cr in normal range and within 360 days    Creatinine, Ser  Date Value Ref Range Status  02/22/2020 0.68 0.57 - 1.00 mg/dL Final          Passed - Ca in normal range and within 360 days    Calcium  Date Value Ref Range Status  02/22/2020 9.8 8.7 - 10.3 mg/dL Final          Passed - Last BP in normal range    BP Readings from Last 1 Encounters:  02/22/20 128/84

## 2020-10-10 ENCOUNTER — Telehealth (INDEPENDENT_AMBULATORY_CARE_PROVIDER_SITE_OTHER): Payer: PRIVATE HEALTH INSURANCE | Admitting: Family Medicine

## 2020-10-10 ENCOUNTER — Telehealth: Payer: Self-pay

## 2020-10-10 DIAGNOSIS — F419 Anxiety disorder, unspecified: Secondary | ICD-10-CM

## 2020-10-10 DIAGNOSIS — I10 Essential (primary) hypertension: Secondary | ICD-10-CM | POA: Diagnosis not present

## 2020-10-10 DIAGNOSIS — F331 Major depressive disorder, recurrent, moderate: Secondary | ICD-10-CM

## 2020-10-10 NOTE — Progress Notes (Signed)
MyChart Video Visit    Virtual Visit via Video Note   This visit type was conducted due to national recommendations for restrictions regarding the COVID-19 Pandemic (e.g. social distancing) in an effort to limit this patient's exposure and mitigate transmission in our community. This patient is at least at moderate risk for complications without adequate follow up. This format is felt to be most appropriate for this patient at this time. Physical exam was limited by quality of the video and audio technology used for the visit.   Patient location: home Provider location: [facility]  I discussed the limitations of evaluation and management by telemedicine and the availability of in person appointments. The patient expressed understanding and agreed to proceed.  Patient: [name]   DOB: [birth year]   60 y.o. Female  MRN: [id number] Visit Date: 10/10/2020  Today's healthcare provider: Mila Merry, MD   No chief complaint on file.  Subjective    HPI  Hypertension, follow-up  BP Readings from Last 3 Encounters:  02/22/20 128/84  03/01/19 116/72  10/26/18 110/70   Wt Readings from Last 3 Encounters:  02/22/20 234 lb (106.1 kg)  03/01/19 237 lb (107.5 kg)  10/26/18 232 lb (105.2 kg)     She was last seen for hypertension 8 months ago.  BP at that visit was 128/84. Management since that visit includes no changes.  She reports excellent compliance with treatment. She is not having side effects.  She is following a Low Sodium diet. She is exercising. She does not smoke.  Use of agents associated with hypertension: none.   Outside blood pressures are normal. Symptoms: No chest pain No chest pressure  No palpitations No syncope  No dyspnea No orthopnea  No paroxysmal nocturnal dyspnea No lower extremity edema   Pertinent labs: Lab Results  Component Value Date   CHOL 215 (H) 02/22/2020   HDL 60 02/22/2020   LDLCALC 144 (H) 02/22/2020   TRIG 61 02/22/2020   CHOLHDL  3.6 02/22/2020   Lab Results  Component Value Date   NA 141 02/22/2020   K 3.8 02/22/2020   CREATININE 0.68 02/22/2020   GFRNONAA 95 02/22/2020   GFRAA 109 02/22/2020   GLUCOSE 109 (H) 02/22/2020     The ASCVD Risk score (Arnett DK, et al., 2019) failed to calculate for the following reasons:   Unable to determine if patient is Non-Hispanic African American   ---------------------------------------------------------------------------------------------------  Has prescription for diazepam for sleep but rarely takes. Paroxetine continue to work well, is taking once a day without .  Medications: Outpatient Medications Prior to Visit  Medication Sig   triamterene-hydrochlorothiazide (MAXZIDE-25) 37.5-25 MG tablet TAKE 1 TABLET BY MOUTH EVERY DAY (MAX UNTIL VISIT, INS WILL NOT COVER UNLESS 90)   diazepam (VALIUM) 5 MG tablet Take 1-2 tablets (5-10 mg total) by mouth at bedtime. To help sleep   ibuprofen (ADVIL,MOTRIN) 200 MG tablet Take 200 mg by mouth every 6 (six) hours as needed.   naproxen sodium (ANAPROX) 220 MG tablet Take 220 mg by mouth 2 (two) times daily as needed (for pain).   PARoxetine (PAXIL) 20 MG tablet Take 1 tablet (20 mg total) by mouth every evening.   No facility-administered medications prior to visit.    Review of Systems  Constitutional: Negative.   Respiratory: Negative.    Cardiovascular: Negative.   Gastrointestinal: Negative.   Neurological:  Negative for dizziness, syncope, speech difficulty, light-headedness, numbness and headaches.     Objective  There were no vitals taken for this visit.   Physical Exam   Awake, alert, oriented x 3. In no apparent distress   Assessment & Plan     1. Moderate episode of recurrent major depressive disorder (HCC) Doing well with paroxetine. Continue current medications.    2. Anxiety Doing well with paroxetine, rarely requires diazepam.   3. Primary hypertension Well controlled.  Continue current  medications.    Follow up in-office visit or CPE in January       I discussed the assessment and treatment plan with the patient. The patient was provided an opportunity to ask questions and all were answered. The patient agreed with the plan and demonstrated an understanding of the instructions.   The patient was advised to call back or seek an in-person evaluation if the symptoms worsen or if the condition fails to improve as anticipated.  I provided 9 minutes of non-face-to-face time during this encounter.  The entirety of the information documented in the History of Present Illness, Review of Systems and Physical Exam were personally obtained by me. Portions of this information were initially documented by the CMA and reviewed by me for thoroughness and accuracy.    Mila Merry, MD [facility] [phone] (phone) [phone] (fax)  [facility]

## 2020-10-10 NOTE — Telephone Encounter (Signed)
Advised pt she is due for a physical/follow up in 01/2021.  She states she is going to call back and schedule it at a later day.  She wants to wait and see what her work schedule is.    Thanks,   -Vernona Rieger

## 2020-10-19 ENCOUNTER — Encounter: Payer: Self-pay | Admitting: Family Medicine

## 2020-10-19 ENCOUNTER — Other Ambulatory Visit: Payer: Self-pay

## 2020-10-19 ENCOUNTER — Ambulatory Visit: Payer: PRIVATE HEALTH INSURANCE | Admitting: Family Medicine

## 2020-10-19 VITALS — BP 141/78 | HR 58 | Temp 98.7°F | Resp 16 | Ht 60.0 in | Wt 232.0 lb

## 2020-10-19 DIAGNOSIS — I890 Lymphedema, not elsewhere classified: Secondary | ICD-10-CM

## 2020-10-19 DIAGNOSIS — H60501 Unspecified acute noninfective otitis externa, right ear: Secondary | ICD-10-CM

## 2020-10-19 DIAGNOSIS — H6121 Impacted cerumen, right ear: Secondary | ICD-10-CM

## 2020-10-19 MED ORDER — NEOMYCIN-POLYMYXIN-HC 3.5-10000-1 OT SOLN: 3.0000 [drp] | Freq: Four times a day (QID) | OTIC | 0 refills | Status: AC

## 2020-10-19 MED ORDER — AMOXICILLIN 500 MG PO CAPS: 1000.0000 mg | ORAL_CAPSULE | Freq: Two times a day (BID) | ORAL | 0 refills | Status: AC

## 2020-10-19 NOTE — Patient Instructions (Addendum)
You can use OTC Debrox ear drops to help clear the wax from your left ear
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
## 2020-12-08 ENCOUNTER — Other Ambulatory Visit: Payer: Self-pay | Admitting: Family Medicine

## 2020-12-08 DIAGNOSIS — F514 Sleep terrors [night terrors]: Secondary | ICD-10-CM

## 2020-12-08 DIAGNOSIS — F4310 Post-traumatic stress disorder, unspecified: Secondary | ICD-10-CM

## 2020-12-08 NOTE — Telephone Encounter (Signed)
Requested Prescriptions  Pending Prescriptions Disp Refills  . PARoxetine (PAXIL) 20 MG tablet [Pharmacy Med Name: PAROXETINE HCL 20 MG TABLET] 90 tablet 0    Sig: TAKE 1 TABLET BY MOUTH EVERY DAY IN THE EVENING     Psychiatry:  Antidepressants - SSRI Passed - 12/08/2020 12:11 PM      Passed - Completed PHQ-2 or PHQ-9 in the last 360 days      Passed - Valid encounter within last 6 months    Recent Outpatient Visits          1 month ago Acute otitis externa of right ear, unspecified type   [facility] Malva Limes, MD   1 month ago Moderate episode of recurrent major depressive disorder [facility])   [facility] Malva Limes, MD   9 months ago Primary hypertension   [facility] Malva Limes, MD   1 year ago Lymphedema of arm   [facility] Malva Limes, MD   2 years ago PTSD (post-traumatic stress disorder)   [facility] Sherrie Mustache, Demetrios Isaacs, MD

## 2020-12-30 ENCOUNTER — Other Ambulatory Visit: Payer: Self-pay | Admitting: Family Medicine

## 2020-12-30 DIAGNOSIS — I10 Essential (primary) hypertension: Secondary | ICD-10-CM

## 2020-12-30 NOTE — Telephone Encounter (Signed)
Requested Prescriptions  Pending Prescriptions Disp Refills  . triamterene-hydrochlorothiazide (MAXZIDE-25) 37.5-25 MG tablet [Pharmacy Med Name: TRIAMTERENE-HCTZ 37.5-25 MG TB] 90 tablet 0    Sig: TAKE 1 TABLET BY MOUTH EVERY DAY (MAX UNTIL VISIT, INS WILL NOT COVER UNLESS 90)     Cardiovascular: Diuretic Combos Failed - 12/30/2020  9:15 AM      Failed - Last BP in normal range    BP Readings from Last 1 Encounters:  10/19/20 (!) 141/78         Passed - K in normal range and within 360 days    Potassium  Date Value Ref Range Status  02/22/2020 3.8 3.5 - 5.2 mmol/L Final         Passed - Na in normal range and within 360 days    Sodium  Date Value Ref Range Status  02/22/2020 141 134 - 144 mmol/L Final         Passed - Cr in normal range and within 360 days    Creatinine, Ser  Date Value Ref Range Status  02/22/2020 0.68 0.57 - 1.00 mg/dL Final         Passed - Ca in normal range and within 360 days    Calcium  Date Value Ref Range Status  02/22/2020 9.8 8.7 - 10.3 mg/dL Final         Passed - Valid encounter within last 6 months    Recent Outpatient Visits          2 months ago Acute otitis externa of right ear, unspecified type   [facility] Malva Limes, MD   2 months ago Moderate episode of recurrent major depressive disorder [facility])   [facility] Malva Limes, MD   10 months ago Primary hypertension   [facility] Malva Limes, MD   1 year ago Lymphedema of arm   [facility] Malva Limes, MD   2 years ago PTSD (post-traumatic stress disorder)   [facility] Sherrie Mustache, Demetrios Isaacs, MD

## 2021-03-06 ENCOUNTER — Other Ambulatory Visit: Payer: Self-pay | Admitting: Family Medicine

## 2021-03-06 DIAGNOSIS — F514 Sleep terrors [night terrors]: Secondary | ICD-10-CM

## 2021-03-06 DIAGNOSIS — F4310 Post-traumatic stress disorder, unspecified: Secondary | ICD-10-CM

## 2021-03-06 NOTE — Telephone Encounter (Signed)
Requested Prescriptions  Pending Prescriptions Disp Refills   PARoxetine (PAXIL) 20 MG tablet [Pharmacy Med Name: PAROXETINE HCL 20 MG TABLET] 90 tablet 0    Sig: TAKE 1 TABLET BY MOUTH EVERY DAY IN THE EVENING     Psychiatry:  Antidepressants - SSRI Passed - 03/06/2021  1:36 AM      Passed - Completed PHQ-2 or PHQ-9 in the last 360 days      Passed - Valid encounter within last 6 months    Recent Outpatient Visits          4 months ago Acute otitis externa of right ear, unspecified type   [facility] Malva Limes, MD   4 months ago Moderate episode of recurrent major depressive disorder [facility])   [facility] Malva Limes, MD   1 year ago Primary hypertension   [facility] Malva Limes, MD   2 years ago Lymphedema of arm   [facility] Malva Limes, MD   2 years ago PTSD (post-traumatic stress disorder)   [facility] Sherrie Mustache, Demetrios Isaacs, MD

## 2021-03-15 ENCOUNTER — Telehealth: Payer: Self-pay

## 2021-03-15 NOTE — Telephone Encounter (Signed)
Copied from CRM [id number]. Topic: General - Other >> Mar 15, 2021  8:40 AM Traci Sermon wrote: Reason for CRM: Pt called in wanting to get a print off of her Covid vaccine shot, pt requested if she could get a call back when it was ready, please advise.

## 2021-03-15 NOTE — Telephone Encounter (Signed)
Patient advised that I will print of copy of immunization report and leave at front desk for pick up. KW

## 2021-04-03 ENCOUNTER — Other Ambulatory Visit: Payer: Self-pay | Admitting: Family Medicine

## 2021-04-03 DIAGNOSIS — I10 Essential (primary) hypertension: Secondary | ICD-10-CM

## 2021-06-02 ENCOUNTER — Other Ambulatory Visit: Payer: Self-pay | Admitting: Family Medicine

## 2021-06-02 DIAGNOSIS — F4310 Post-traumatic stress disorder, unspecified: Secondary | ICD-10-CM

## 2021-06-02 DIAGNOSIS — F514 Sleep terrors [night terrors]: Secondary | ICD-10-CM

## 2021-07-01 ENCOUNTER — Ambulatory Visit (INDEPENDENT_AMBULATORY_CARE_PROVIDER_SITE_OTHER): Payer: PRIVATE HEALTH INSURANCE | Admitting: Family Medicine

## 2021-07-01 ENCOUNTER — Encounter: Payer: Self-pay | Admitting: Family Medicine

## 2021-07-01 ENCOUNTER — Ambulatory Visit: Payer: Self-pay

## 2021-07-01 VITALS — BP 128/79 | HR 57 | Temp 98.6°F | Resp 16 | Ht 60.0 in | Wt 238.3 lb

## 2021-07-01 DIAGNOSIS — M7551 Bursitis of right shoulder: Secondary | ICD-10-CM | POA: Diagnosis not present

## 2021-07-01 MED ORDER — CELECOXIB 200 MG PO CAPS: 200.0000 mg | ORAL_CAPSULE | Freq: Two times a day (BID) | ORAL | 0 refills | Status: DC

## 2021-07-01 NOTE — Telephone Encounter (Addendum)
Pt refused triage. Just wants appt. Pt stated these issues are chronic complaints.  Right shoulder pain and right arm "lymphedema: Denies redness  Appt scheduled today with PCP.

## 2021-07-01 NOTE — Progress Notes (Signed)
      I,Tiffany J Bragg,acting as a scribe for Mila Merry, MD.,have documented all relevant documentation on the behalf of Mila Merry, MD,as directed by  Mila Merry, MD while in the presence of Mila Merry, MD.  Established patient visit   Patient: [name]   DOB: 15-Jun-1960   61 y.o. Female  MRN: [id number] Visit Date: 07/01/2021  Today's healthcare provider: Mila Merry, MD   Chief Complaint  Patient presents with   Arm Pain    Patient complains of R shoulder and arm pain for 3 days that she thinks is worsening caused by lymphedema.    Subjective    Shoulder Pain  The pain is present in the right shoulder and right arm.   HPI     Arm Pain    Additional comments: Patient complains of R shoulder and arm pain for 3 days that she thinks is worsening caused by lymphedema.       Last edited by Marlana Salvage, CMA on 07/01/2021  1:17 PM.    No known recent injuries. 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  No fevers chills or sweats.   Medications: Outpatient Medications Prior to Visit  Medication Sig   triamterene-hydrochlorothiazide (MAXZIDE-25) 37.5-25 MG tablet TAKE 1 TABLET BY MOUTH EVERY DAY (MAX UNTIL VISIT, INS WILL NOT COVER UNLESS 90)   diazepam (VALIUM) 5 MG tablet Take 1-2 tablets (5-10 mg total) by mouth at bedtime. To help sleep   ibuprofen (ADVIL,MOTRIN) 200 MG tablet Take 200 mg by mouth every 6 (six) hours as needed. (Patient not taking: Reported on 07/01/2021)   naproxen sodium (ANAPROX) 220 MG tablet Take 220 mg by mouth 2 (two) times daily as needed (for pain). (Patient not taking: Reported on 07/01/2021)   PARoxetine (PAXIL) 20 MG tablet TAKE 1 TABLET BY MOUTH EVERY DAY IN THE EVENING (Patient not taking: Reported on 07/01/2021)   No facility-administered medications prior to visit.         Objective    BP 128/79 (BP Location: Left Arm, Patient Position: Sitting, Cuff Size: Large)   Pulse (!) 57   Temp  98.6 F (37 C) (Oral)   Resp 16   Ht 5' (1.524 m)   Wt 238 lb 4.8 oz (108.1 kg)   SpO2 98%   BMI 46.54 kg/m    Physical Exam   Pain with external and internal rotation of right shoulder. Pain with abduction and flexion of right shoulder. Tender along lateral aspect of upper shoulder and upper arm. No erythema. No point tenderness.   Assessment & Plan     1. Bursitis of right shoulder Recommend frequent ice applications x 2-3 days, then alternate with heat.  - celecoxib (CELEBREX) 200 MG capsule; Take 1 capsule (200 mg total) by mouth 2 (two) times daily.  Dispense: 60 capsule; Refill: 0   Call if symptoms change or if not rapidly improving.        The entirety of the information documented in the History of Present Illness, Review of Systems and Physical Exam were personally obtained by me. Portions of this information were initially documented by the CMA and reviewed by me for thoroughness and accuracy.     Mila Merry, MD  [facility] [phone] (phone) [phone] (fax)  [facility]

## 2021-07-23 ENCOUNTER — Other Ambulatory Visit: Payer: Self-pay | Admitting: Family Medicine

## 2021-07-23 DIAGNOSIS — M7551 Bursitis of right shoulder: Secondary | ICD-10-CM

## 2021-11-11 ENCOUNTER — Ambulatory Visit: Payer: PRIVATE HEALTH INSURANCE | Admitting: Family Medicine

## 2021-11-11 ENCOUNTER — Encounter: Payer: Self-pay | Admitting: Family Medicine

## 2021-11-11 DIAGNOSIS — M7551 Bursitis of right shoulder: Secondary | ICD-10-CM

## 2021-11-11 MED ORDER — CELECOXIB 200 MG PO CAPS: 200.0000 mg | ORAL_CAPSULE | Freq: Two times a day (BID) | ORAL | 1 refills | Status: DC | PRN
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
## 2022-01-30 ENCOUNTER — Ambulatory Visit: Payer: PRIVATE HEALTH INSURANCE | Admitting: Family Medicine

## 2022-01-30 ENCOUNTER — Encounter: Payer: Self-pay | Admitting: Family Medicine

## 2022-01-30 VITALS — BP 133/82 | HR 61 | Temp 98.1°F | Resp 16 | Wt 240.8 lb

## 2022-01-30 DIAGNOSIS — M62838 Other muscle spasm: Secondary | ICD-10-CM

## 2022-01-30 MED ORDER — CYCLOBENZAPRINE HCL 5 MG PO TABS: 5.0000 mg | ORAL_TABLET | Freq: Three times a day (TID) | ORAL | 1 refills | Status: DC | PRN
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
## 2022-04-09 ENCOUNTER — Other Ambulatory Visit: Payer: Self-pay | Admitting: Family Medicine

## 2022-04-09 DIAGNOSIS — I10 Essential (primary) hypertension: Secondary | ICD-10-CM

## 2022-04-09 NOTE — Telephone Encounter (Signed)
Requested medication (s) are due for refill today: yes  Requested medication (s) are on the active medication list: yes  Last refill:  04/03/21 #90/3  Future visit scheduled: no  Notes to clinic:  Unable to refill per protocol due to failed labs, no updated results.    Requested Prescriptions  Pending Prescriptions Disp Refills   triamterene-hydrochlorothiazide (MAXZIDE-25) 37.5-25 MG tablet [Pharmacy Med Name: TRIAMTERENE-HCTZ 37.5-25 MG TB] 90 tablet 3    Sig: TAKE 1 TABLET BY MOUTH EVERY DAY (MAX UNTIL VISIT, INS WILL NOT COVER UNLESS 90)     Cardiovascular: Diuretic Combos Failed - 04/09/2022  1:45 AM      Failed - K in normal range and within 180 days    Potassium  Date Value Ref Range Status  02/22/2020 3.8 3.5 - 5.2 mmol/L Final         Failed - Na in normal range and within 180 days    Sodium  Date Value Ref Range Status  02/22/2020 141 134 - 144 mmol/L Final         Failed - Cr in normal range and within 180 days    Creatinine, Ser  Date Value Ref Range Status  02/22/2020 0.68 0.57 - 1.00 mg/dL Final         Passed - Last BP in normal range    BP Readings from Last 1 Encounters:  01/30/22 133/82         Passed - Valid encounter within last 6 months    Recent Outpatient Visits           2 months ago Muscle spasms of neck   [place], Dionne Bucy, MD   4 months ago Bursitis of right shoulder   [place] Birdie Sons, MD   9 months ago Bursitis of right shoulder   [place] Birdie Sons, MD   1 year ago Acute otitis externa of right ear, unspecified type   [place], Donald E, MD   1 year ago Moderate episode of recurrent major depressive disorder [facility])    [facility] Caryn Section, Kirstie Peri, MD

## 2022-07-09 ENCOUNTER — Other Ambulatory Visit: Payer: Self-pay | Admitting: Family Medicine

## 2022-07-09 DIAGNOSIS — I10 Essential (primary) hypertension: Secondary | ICD-10-CM

## 2022-07-09 NOTE — Telephone Encounter (Signed)
Requested medication (s) are due for refill today: Due 07/10/22  Requested medication (s) are on the active medication list: yes    Last refill: 04/09/22 #90  0 refills  Future visit scheduled no  Notes to clinic:Failed due to labs, please review. Thank you.  Requested Prescriptions  Pending Prescriptions Disp Refills   triamterene-hydrochlorothiazide (MAXZIDE-25) 37.5-25 MG tablet [Pharmacy Med Name: TRIAMTERENE-HCTZ 37.5-25 MG TB] 90 tablet 0    Sig: TAKE 1 TABLET BY MOUTH EVERY DAY (MAX UNTIL VISIT, INS WILL NOT COVER UNLESS 90)     Cardiovascular: Diuretic Combos Failed - 07/09/2022  1:55 AM      Failed - K in normal range and within 180 days    Potassium  Date Value Ref Range Status  02/22/2020 3.8 3.5 - 5.2 mmol/L Final         Failed - Na in normal range and within 180 days    Sodium  Date Value Ref Range Status  02/22/2020 141 134 - 144 mmol/L Final         Failed - Cr in normal range and within 180 days    Creatinine, Ser  Date Value Ref Range Status  02/22/2020 0.68 0.57 - 1.00 mg/dL Final         Passed - Last BP in normal range    BP Readings from Last 1 Encounters:  01/30/22 133/82         Passed - Valid encounter within last 6 months    Recent Outpatient Visits           5 months ago Muscle spasms of neck   [place] [facility] Edson, Marzella Schlein, MD   8 months ago Bursitis of right shoulder   [place] [facility] Malva Limes, MD   1 year ago Bursitis of right shoulder   [place] [facility] Malva Limes, MD   1 year ago Acute otitis externa of right ear, unspecified type   [facility] Malva Limes, MD   1 year ago Moderate episode of recurrent major depressive disorder [facility])   [facility] Sherrie Mustache, Demetrios Isaacs, MD

## 2022-07-25 ENCOUNTER — Encounter: Payer: Self-pay | Admitting: Physician Assistant

## 2022-07-25 ENCOUNTER — Ambulatory Visit: Payer: PRIVATE HEALTH INSURANCE | Admitting: Physician Assistant

## 2022-07-25 VITALS — BP 123/68 | HR 59 | Temp 98.2°F | Ht 60.0 in | Wt 226.0 lb

## 2022-07-25 DIAGNOSIS — M79604 Pain in right leg: Secondary | ICD-10-CM | POA: Diagnosis not present

## 2022-07-25 DIAGNOSIS — F419 Anxiety disorder, unspecified: Secondary | ICD-10-CM

## 2022-07-25 DIAGNOSIS — T148XXA Other injury of unspecified body region, initial encounter: Secondary | ICD-10-CM

## 2022-07-25 DIAGNOSIS — I10 Essential (primary) hypertension: Secondary | ICD-10-CM

## 2022-07-25 DIAGNOSIS — F32A Depression, unspecified: Secondary | ICD-10-CM

## 2022-07-25 MED ORDER — CYCLOBENZAPRINE HCL 5 MG PO TABS
5.0000 mg | ORAL_TABLET | Freq: Three times a day (TID) | ORAL | 0 refills | Status: AC | PRN
Start: 2022-07-25 — End: ?

## 2022-07-25 MED ORDER — CELECOXIB 200 MG PO CAPS
200.0000 mg | ORAL_CAPSULE | Freq: Two times a day (BID) | ORAL | 0 refills | Status: AC | PRN
Start: 2022-07-25 — End: ?

## 2022-07-25 MED ORDER — TRIAMTERENE-HCTZ 37.5-25 MG PO TABS: ORAL_TABLET | ORAL | 0 refills | Status: DC

## 2022-07-25 NOTE — Progress Notes (Signed)
Established patient visit  Patient: [name]   DOB: 1960/05/24   62 y.o. Female  MRN: [id number] Visit Date: 07/25/2022  Today's healthcare provider: Debera Lat, PA-C   Chief Complaint  Patient presents with   Hypertension   Medical Management of Chronic Issues   Muscle Pain   Subjective     HPI   Pt is having right leg pain that started 2 weeks ago. Pt also needs a refill on Maxzide. Last edited by Daneen Schick, CMA on 07/25/2022  9:56 AM.      Discussed the use of AI scribe software for clinical note transcription with the patient, who gave verbal consent Tyler proceed.  History of Present Illness   The patient presents for a follow-up visit for hypertension, anxiety, and a muscle sprain. She has been managing their hypertension with medication, but they are running low on their prescription. She has not had lab work done in two years, and the doctor recommends updating their blood work Tyler ensure the medication is not negatively affecting their kidney or electrolyte function.  The patient also reports high levels of anxiety due Tyler issues at work. She is currently seeing a counselor Tyler manage their anxiety and prefer not Tyler take additional medication for it.  Lastly, the patient reports a muscle sprain in their leg that has been bothering them for a couple of weeks. She believes they pulled a muscle while lifting and climbing at work. The patient has been using a heating pad Tyler manage the pain and has a weight restriction of 20 pounds at work due Tyler the injury.           07/25/2022    9:58 AM 11/11/2021   10:50 AM 10/19/2020    1:26 PM  Depression screen PHQ 2/9  Decreased Interest 1 0 0  Down, Depressed, Hopeless 1 1 0  PHQ - 2 Score 2 1 0  Altered sleeping 1 1 1   Tired, decreased energy 1 1 0  Change in appetite  0 0  Feeling bad or failure about yourself  1 0 0  Trouble concentrating 1 0 0  Moving slowly or fidgety/restless 0 0 0  Suicidal thoughts 0 0 0   PHQ-9 Score 6 3 1   Difficult doing work/chores Very difficult Not difficult at all Not difficult at all      07/25/2022    9:58 AM  GAD 7 : Generalized Anxiety Score  Nervous, Anxious, on Edge 3  Control/stop worrying 3  Worry too much - different things 1  Trouble relaxing 3  Restless 0  Easily annoyed or irritable 3  Afraid - awful might happen 3  Total GAD 7 Score 16  Anxiety Difficulty Somewhat difficult      07/25/2022    9:58 AM  GAD 7 : Generalized Anxiety Score  Nervous, Anxious, on Edge 3  Control/stop worrying 3  Worry too much - different things 1  Trouble relaxing 3  Restless 0  Easily annoyed or irritable 3  Afraid - awful might happen 3  Total GAD 7 Score 16  Anxiety Difficulty Somewhat difficult      Medications: Outpatient Medications Prior Tyler Visit  Medication Sig Note   [DISCONTINUED] celecoxib (CELEBREX) 200 MG capsule Take 1 capsule (200 mg total) by mouth 2 (two) times daily as needed. 07/25/2022: As needed   [DISCONTINUED] cyclobenzaprine (FLEXERIL) 5 MG tablet Take 1 tablet (5 mg total) by mouth 3 (three) times daily as needed for muscle spasms.  07/25/2022: As needed   [DISCONTINUED] triamterene-hydrochlorothiazide (MAXZIDE-25) 37.5-25 MG tablet TAKE 1 TABLET BY MOUTH EVERY DAY (MAX UNTIL VISIT, INS WILL NOT COVER UNLESS 90)    No facility-administered medications prior Tyler visit.    Review of Systems  All other systems reviewed and are negative.  Except see HPI      Objective    BP 123/68   Pulse (!) 59   Temp 98.2 F (36.8 C) (Oral)   Ht 5' (1.524 m)   Wt 226 lb (102.5 kg)   SpO2 99%   BMI 44.14 kg/m    Physical Exam Vitals reviewed.  Constitutional:      General: She is not in acute distress.    Appearance: Normal appearance. She is well-developed. She is not diaphoretic.  HENT:     Head: Normocephalic and atraumatic.  Eyes:     General: No scleral icterus.    Conjunctiva/sclera: Conjunctivae normal.  Neck:     Thyroid:  No thyromegaly.  Cardiovascular:     Rate and Rhythm: Normal rate and regular rhythm.     Pulses: Normal pulses.     Heart sounds: Normal heart sounds. No murmur heard. Pulmonary:     Effort: Pulmonary effort is normal. No respiratory distress.     Breath sounds: Normal breath sounds. No wheezing, rhonchi or rales.  Musculoskeletal:        General: Tenderness present. No swelling, deformity or signs of injury. Normal range of motion.     Cervical back: Neck supple.     Right lower leg: No edema.     Left lower leg: No edema.     Comments: No swelling, anterior/posterior draw negative, Murphy's sign negative, no palpable bumps, pain on palpation in posterior right thigh  Lymphadenopathy:     Cervical: No cervical adenopathy.  Skin:    General: Skin is warm and dry.     Findings: No rash.  Neurological:     Mental Status: She is alert and oriented Tyler person, place, and time. Mental status is at baseline.  Psychiatric:        Mood and Affect: Mood normal.        Behavior: Behavior normal.      No results found for any visits on 07/25/22.  Assessment & Plan    Hypertension: Well controlled today. Last labs were two years ago, need Tyler update Tyler ensure safe continuation of medication. -Order labs today Tyler check kidney and electrolyte function. -Continue current antihypertensive medication. - Comprehensive metabolic panel - Lipid panel - CBC with Differential/Platelet - triamterene-hydrochlorothiazide (MAXZIDE-25) 37.5-25 MG tablet; TAKE 1 TABLET BY MOUTH EVERY DAY (MAX UNTIL VISIT, INS WILL NOT COVER UNLESS 90)  Dispense: 90 tablet; Refill: 0 Will reassess after lab results  Anxiety and depression High score for anxiety, low for depression. Chronic.  Anxiety: High level due Tyler work-related issues. Currently seeing a counselor. -Continue counseling. No pharmacological intervention at this time. Will reassess.  Muscle strain/sprain  due Tyler lifting heavy objects. Recurrent  problem. Right leg pain for a few weeks, likely due Tyler muscle strain/sprain. No swelling or bumps noted on examination. -Continue heat therapy. -Prescribe Celecoxib and Flexeril for pain management. -Advise rest, elevation, and use of a sleeve for support. -Consider light work duties temporarily. - celecoxib (CELEBREX) 200 MG capsule; Take 1 capsule (200 mg total) by mouth 2 (two) times daily as needed.  Dispense: 60 capsule; Refill: 0 General Health Maintenance: -Schedule physical exam, Pap smear, and mammogram with  Dr. Sherrie Mustache in six months.     Return in about 6 months (around 01/24/2023) for chronic disease f/u, CPE including pap smear and mammogram.     The patient was advised Tyler call back or seek an in-person evaluation if the symptoms worsen or if the condition fails Tyler improve as anticipated.  I discussed the assessment and treatment plan with the patient. The patient was provided an opportunity Tyler ask questions and all were answered. The patient agreed with the plan and demonstrated an understanding of the instructions.  I, Debera Lat, PA-C have reviewed all documentation for this visit. The documentation on 07/25/2022 for the exam, diagnosis, procedures, and orders are all accurate and complete.  Debera Lat, [facility], MMS [facility] [phone] (phone) [phone] (fax)  [facility]

## 2022-07-26 LAB — COMPREHENSIVE METABOLIC PANEL
ALT: 17 IU/L (ref 0–32)
AST: 20 IU/L (ref 0–40)
Albumin: 4 g/dL (ref 3.9–4.9)
Alkaline Phosphatase: 73 IU/L (ref 44–121)
BUN/Creatinine Ratio: 16 (ref 12–28)
BUN: 12 mg/dL (ref 8–27)
Bilirubin Total: 0.4 mg/dL (ref 0.0–1.2)
CO2: 28 mmol/L (ref 20–29)
Calcium: 9.7 mg/dL (ref 8.7–10.3)
Chloride: 99 mmol/L (ref 96–106)
Creatinine, Ser: 0.73 mg/dL (ref 0.57–1.00)
Globulin, Total: 3 g/dL (ref 1.5–4.5)
Glucose: 102 mg/dL — ABNORMAL HIGH (ref 70–99)
Potassium: 3.4 mmol/L — ABNORMAL LOW (ref 3.5–5.2)
Sodium: 140 mmol/L (ref 134–144)
Total Protein: 7 g/dL (ref 6.0–8.5)
eGFR: 92 mL/min/{1.73_m2} (ref >59)

## 2022-07-26 LAB — LIPID PANEL
Chol/HDL Ratio: 4.1 ratio (ref 0.0–4.4)
Cholesterol, Total: 195 mg/dL (ref 100–199)
HDL: 48 mg/dL (ref >39)
LDL Chol Calc (NIH): 132 mg/dL — ABNORMAL HIGH (ref 0–99)
Triglycerides: 83 mg/dL (ref 0–149)
VLDL Cholesterol Cal: 15 mg/dL (ref 5–40)

## 2022-07-26 LAB — CBC WITH DIFFERENTIAL/PLATELET
Basophils Absolute: 0 10*3/uL (ref 0.0–0.2)
Basos: 1 %
EOS (ABSOLUTE): 0.1 10*3/uL (ref 0.0–0.4)
Eos: 3 %
Hematocrit: 41.6 % (ref 34.0–46.6)
Hemoglobin: 13.5 g/dL (ref 11.1–15.9)
Immature Grans (Abs): 0 10*3/uL (ref 0.0–0.1)
Immature Granulocytes: 0 %
Lymphocytes Absolute: 2.1 10*3/uL (ref 0.7–3.1)
Lymphs: 52 %
MCH: 28.8 pg (ref 26.6–33.0)
MCHC: 32.5 g/dL (ref 31.5–35.7)
MCV: 89 fL (ref 79–97)
Monocytes Absolute: 0.3 10*3/uL (ref 0.1–0.9)
Monocytes: 8 %
Neutrophils Absolute: 1.4 10*3/uL (ref 1.4–7.0)
Neutrophils: 36 %
Platelets: 245 10*3/uL (ref 150–450)
RBC: 4.68 x10E6/uL (ref 3.77–5.28)
RDW: 12.7 % (ref 11.7–15.4)
WBC: 4 10*3/uL (ref 3.4–10.8)

## 2022-09-07 ENCOUNTER — Encounter: Payer: Self-pay | Admitting: Physician Assistant

## 2022-10-20 ENCOUNTER — Other Ambulatory Visit: Payer: Self-pay | Admitting: Physician Assistant

## 2022-10-20 DIAGNOSIS — I10 Essential (primary) hypertension: Secondary | ICD-10-CM

## 2023-01-02 ENCOUNTER — Encounter: Payer: Self-pay | Admitting: Family Medicine

## 2023-01-02 ENCOUNTER — Ambulatory Visit: Payer: PRIVATE HEALTH INSURANCE | Admitting: Family Medicine

## 2023-01-02 VITALS — BP 130/75 | HR 68 | Temp 98.2°F | Ht 60.0 in | Wt 230.0 lb

## 2023-01-02 DIAGNOSIS — M79674 Pain in right toe(s): Secondary | ICD-10-CM | POA: Insufficient documentation

## 2023-01-02 DIAGNOSIS — G8929 Other chronic pain: Secondary | ICD-10-CM | POA: Diagnosis not present

## 2023-01-02 DIAGNOSIS — M25511 Pain in right shoulder: Secondary | ICD-10-CM

## 2023-01-02 DIAGNOSIS — I890 Lymphedema, not elsewhere classified: Secondary | ICD-10-CM | POA: Diagnosis not present

## 2023-01-02 MED ORDER — NAPROXEN 500 MG PO TABS
500.0000 mg | ORAL_TABLET | Freq: Two times a day (BID) | ORAL | 1 refills | Status: AC | PRN
Start: 2023-01-02 — End: ?

## 2023-01-02 NOTE — Assessment & Plan Note (Addendum)
Pt presents for concerns for R great toe pain for last several days. 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 Her toe is not currently hurting during visit, but there was some tenderness with assessment and R great toe extension. There was no obvious erythema, edema, or warmth to R great toe joint. Denies pain with socks, shoes, bed sheets, or ambulation. No fatigue, fevers, chills, or general systemic symptoms. Given HPI and physical less likely to be gout, and more likely over use injury to R great toe due to daily toe extension with gas pedal pressing. Likely inflammation and potential development of some arthritis in joint.   Patient Shared decision made - based on presentation and no previous hx of gout - option to test for Uric acids levels were discussed - held off on testing  Discussed elevating feet, icing toes, and wearing supportive shoes, that are not tight or rubbing on toes, toe joints or heels.   Discussed benefit to tylenol or NSAID for pain relief if needed.  Discussed need for further evaluation of great toe if pain worsens, tenderness increases, that leads to inability to put shoes on or affects walking.

## 2023-01-02 NOTE — Assessment & Plan Note (Addendum)
R should pain gradually worsening since worsening lymphedema build up. Leading to pressure on R should joint. Pain with should extension, abduction, and internal rotation. No clicking, popping, or crepitus felt. Full ROM otherwise  Pt intermittently uses ibuprofen for pain mgmt (2-3x per week, 200-400mg ), as well as topical lidocaine.   She is hesitant to try any other medication options. Discussed trialing a different NSAID.  Naproxen prescribed. Educated on use of NSAIDs and to not take any other NSAID while taking Naproxen. Keep hydrated well while taking.   May ice or heat shoulder - whichever is more relieving to pt.   May continue topical lidocaine.  Physical therapy order placed for referral for lymphedema and shoulder pain assessment.  Will hold on imaging - as shoulder pain seems to be from increased lymphedema in R arm leading to pressure on R shoulder.   Discussed follow up sooner if pain worsens or mobility decreases.
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
## 2023-01-02 NOTE — Assessment & Plan Note (Signed)
R arm lymphedema - several years since seeing, lymphedema therapist. Sleeve several years old, and lymphedema fluid build as increased over last several months.   Referral for new physical therapy assessment for her lymphedema, to assist in acquiring new appropriate sleeve and wraps for R arm, along with physical therapy and strengthening R shoulder

## 2023-01-07 ENCOUNTER — Encounter: Payer: PRIVATE HEALTH INSURANCE | Admitting: Physician Assistant

## 2023-01-22 ENCOUNTER — Other Ambulatory Visit: Payer: Self-pay | Admitting: Physician Assistant

## 2023-01-22 DIAGNOSIS — I10 Essential (primary) hypertension: Secondary | ICD-10-CM

## 2023-03-03 ENCOUNTER — Ambulatory Visit: Payer: 59 | Admitting: Occupational Therapy

## 2023-03-13 ENCOUNTER — Encounter: Payer: Self-pay | Admitting: Occupational Therapy

## 2023-03-13 ENCOUNTER — Ambulatory Visit: Payer: 59 | Attending: Family Medicine | Admitting: Occupational Therapy

## 2023-03-13 DIAGNOSIS — I890 Lymphedema, not elsewhere classified: Secondary | ICD-10-CM | POA: Insufficient documentation
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
## 2023-04-22 ENCOUNTER — Other Ambulatory Visit: Payer: Self-pay | Admitting: Family Medicine

## 2023-04-22 DIAGNOSIS — I10 Essential (primary) hypertension: Secondary | ICD-10-CM

## 2023-08-27 ENCOUNTER — Encounter: Payer: Self-pay | Admitting: Family Medicine

## 2023-08-27 ENCOUNTER — Ambulatory Visit (INDEPENDENT_AMBULATORY_CARE_PROVIDER_SITE_OTHER): Admitting: Family Medicine

## 2023-08-27 VITALS — BP 130/78 | HR 70 | Temp 98.6°F | Ht 60.0 in | Wt 224.2 lb

## 2023-08-27 DIAGNOSIS — M7051 Other bursitis of knee, right knee: Secondary | ICD-10-CM | POA: Diagnosis not present

## 2023-08-27 DIAGNOSIS — M7662 Achilles tendinitis, left leg: Secondary | ICD-10-CM | POA: Diagnosis not present

## 2023-08-27 MED ORDER — IBUPROFEN 800 MG PO TABS: 800.0000 mg | ORAL_TABLET | Freq: Three times a day (TID) | ORAL | 0 refills | Status: DC | PRN

## 2023-08-27 NOTE — Progress Notes (Unsigned)
 Acute Office Visit  Subjective:     Patient ID: [name], female    DOB: May 05, 1960, 63 y.o.   MRN: [id number]  Chief Complaint  Patient presents with   Knee Pain    Onset 2 weeks, problems during walking- will buckle when she walks occasionally    Foot Pain    Left heel pain, onset 2 weeks. States when she has shoes on and walking she does not notice pain. With sandals or slide son she will notice pain    [name] is a 63 yo F who presents to the clinic acutely for right knee pain and left heel pain.  On interview, she reports that she has been experiencing right knee pain for the past few weeks. She denies any history of inciting trauma. She endorses worsening of her pain when standing or walking. Her pain improves when with rest, ice, and 800 mg ibuprofen . At her worst, she describes the pain as an 8/10. She reports decreased range of motion compared to her right knee, but denies any other swelling, warmth, or redness in the area.  She also reports pain in her left heel area for the past few weeks. She denies any inciting trauma. She endorses tenderness along her Achilles tendon. She states that wearing slides makes her pain worse while wearing closed-toed shoes helps relieve her pain. She also endorses decreased range of motion compared to her right ankle, but denies any other symptoms relating to her ankle.   She reports no other concerns today.    ROS      Objective:    BP 130/78 (BP Location: Left Arm, Patient Position: Sitting, Cuff Size: Normal)   Pulse 70   Temp 98.6 F (37 C) (Oral)   Ht 5' (1.524 m)   Wt 224 lb 3.2 oz (101.7 kg)   SpO2 98%   BMI 43.79 kg/m  {Vitals History (Optional):23777}  Physical Exam Vitals reviewed.  Constitutional:      General: She is not in acute distress.    Appearance: Normal appearance. She is not ill-appearing or diaphoretic.  HENT:     Head: Normocephalic.     Right Ear: Tympanic membrane, ear canal and external  ear normal.     Left Ear: Tympanic membrane, ear canal and external ear normal.     Nose: Nose normal.     Mouth/Throat:     Mouth: Mucous membranes are moist.     Pharynx: Oropharynx is clear. No oropharyngeal exudate or posterior oropharyngeal erythema.  Eyes:     General: No scleral icterus.    Conjunctiva/sclera: Conjunctivae normal.     Pupils: Pupils are equal, round, and reactive to light.  Cardiovascular:     Rate and Rhythm: Normal rate and regular rhythm.     Pulses: Normal pulses.     Heart sounds: Normal heart sounds. No murmur heard.    No friction rub. No gallop.  Pulmonary:     Effort: Pulmonary effort is normal. No respiratory distress.     Breath sounds: Normal breath sounds. No wheezing.  Abdominal:     General: There is no distension.     Palpations: Abdomen is soft.     Tenderness: There is no abdominal tenderness. There is no guarding.  Musculoskeletal:     Cervical back: Normal range of motion.     Right knee: Decreased range of motion. Tenderness (per anserine region) present.     Left knee: Normal.     Right  lower leg: Tenderness present. No edema.     Left lower leg: Normal. No edema.     Right ankle: Normal.     Right Achilles Tendon: No tenderness or defects.     Left ankle:     Left Achilles Tendon: Tenderness present. No defects.     Comments: Tenderness present medial to tibial tuberosity (per anserine region)  Lymphadenopathy:     Cervical: No cervical adenopathy.  Skin:    General: Skin is warm and dry.     Capillary Refill: Capillary refill takes less than 2 seconds.  Neurological:     Mental Status: She is alert and oriented to person, place, and time.  Psychiatric:        Mood and Affect: Mood normal.     No results found for any visits on 08/27/23.      Assessment & Plan:   Problem List Items Addressed This Visit   None Visit Diagnoses       Pes anserinus bursitis of right knee    -  Primary     Achilles tendinitis, left leg           Per anserinus bursitis of right knee Currently not managed. Patient presents with tenderness medial to her right tibial tuberosity, especially with motion. Denies any inciting trauma, but experiencing decreased range of motion due to the pain. - Discussed conservative treatment including ice, rest, elevation, and stretching exercises/PT - Start ibuprofen  800 mg as needed for pain relief, up to three times a day  Achilles tendinitis, left leg Currently not managed. Patient experiencing tenderness and pain along the lower Achilles tendon without any ankle or upper calf tenderness. Denies any trauma, but experiencing decreased range of motion due to the pain. - Discussed conservative treatment including ice, rest, elevation, and stretching exercises/PT - Start ibuprofen  800 mg as needed for pain relief, up to three times a day  Meds ordered this encounter  Medications   ibuprofen  (ADVIL ) 800 MG tablet    Sig: Take 1 tablet (800 mg total) by mouth every 8 (eight) hours as needed.    Dispense:  90 tablet    Refill:  0    Return if symptoms worsen or fail to improve.  Elia LULLA Blanch, Medical Student

## 2023-09-23 ENCOUNTER — Other Ambulatory Visit: Payer: Self-pay | Admitting: Family Medicine

## 2023-10-22 ENCOUNTER — Other Ambulatory Visit: Payer: Self-pay | Admitting: Family Medicine
